# Patient Record
Sex: Female | Born: 1990 | Race: Black or African American | Hispanic: No | Smoking: Never smoker
Health system: Southern US, Community
[De-identification: ages and names within clinical notes are randomized; demographics above are authoritative.]

## PROBLEM LIST (undated history)

## (undated) DIAGNOSIS — Z789 Other specified health status: Secondary | ICD-10-CM

## (undated) DIAGNOSIS — K259 Gastric ulcer, unspecified as acute or chronic, without hemorrhage or perforation: Secondary | ICD-10-CM

## (undated) DIAGNOSIS — T8859XA Other complications of anesthesia, initial encounter: Secondary | ICD-10-CM

## (undated) HISTORY — PX: NO PAST SURGERIES: SHX2092

## (undated) HISTORY — PX: WISDOM TOOTH EXTRACTION: SHX21

---
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
## 2010-12-15 LAB — COMPREHENSIVE METABOLIC PANEL
ALT: 27 U/L (ref 0–35)
AST: 20 U/L (ref 0–37)
Alkaline Phosphatase: 65 U/L (ref 39–117)
CO2: 25 mEq/L (ref 19–32)
Chloride: 110 mEq/L (ref 96–112)
Creatinine, Ser: 0.7 mg/dL (ref 0.4–1.2)
GFR calc Af Amer: >60 mL/min (ref >60)
GFR calc non Af Amer: >60 mL/min (ref >60)
Total Bilirubin: 0.3 mg/dL (ref 0.3–1.2)

## 2010-12-15 LAB — DIFFERENTIAL
Basophils Absolute: 0 10*3/uL (ref 0.0–0.1)
Basophils Relative: 0 % (ref 0–1)
Eosinophils Absolute: 0 10*3/uL (ref 0.0–0.7)
Eosinophils Relative: 0 % (ref 0–5)

## 2010-12-15 LAB — URINALYSIS, ROUTINE W REFLEX MICROSCOPIC
Glucose, UA: NEGATIVE mg/dL
Hgb urine dipstick: NEGATIVE
Specific Gravity, Urine: 1.021 (ref 1.005–1.030)
Urobilinogen, UA: 1 mg/dL (ref 0.0–1.0)

## 2010-12-15 LAB — CBC
MCV: 91.8 fL (ref 78.0–100.0)
RBC: 3.98 MIL/uL (ref 3.87–5.11)
WBC: 11 10*3/uL — ABNORMAL HIGH (ref 4.0–10.5)

## 2010-12-15 LAB — LIPASE, BLOOD: Lipase: 38 U/L (ref 23–300)

## 2018-02-03 ENCOUNTER — Emergency Department (HOSPITAL_COMMUNITY)
Admission: EM | Admit: 2018-02-03 | Discharge: 2018-02-03 | Disposition: A | Discharge status: Home / Self Care | Payer: Self-pay | Attending: Emergency Medicine | Admitting: Emergency Medicine

## 2018-02-03 ENCOUNTER — Encounter (HOSPITAL_COMMUNITY): Payer: Self-pay | Admitting: Emergency Medicine

## 2018-02-03 DIAGNOSIS — L03011 Cellulitis of right finger: Secondary | ICD-10-CM | POA: Insufficient documentation

## 2018-02-03 HISTORY — DX: Gastric ulcer, unspecified as acute or chronic, without hemorrhage or perforation: K25.9

## 2018-02-03 MED ORDER — LIDOCAINE HCL 2 % IJ SOLN
20.0000 mL | Freq: Once | INTRAMUSCULAR | Status: AC
  Administered 2018-02-03: 400 mg
  Filled 2018-02-03: qty 20

## 2018-02-03 NOTE — ED Triage Notes (Signed)
Patient reports swelling and pain around the fingernail of her right middle finger since Tuesday. Patient states she tried to pop the swollen area but was unsuccessful. Skin intact. States she believes the index finger on the same hand is starting to swell too. Denies any other complaints.

## 2018-02-03 NOTE — Discharge Instructions (Signed)
Thank you for allowing me to care of you today in the emergency department.  The wound on your fingertip will continue to drain for the next few days.  This is normal.  Clean the fingertip daily with warm water and soap.  Pat the area dry, then apply a topical antibiotic, such as bacitracin, to the wound.  Then, apply a gauze dressing to protect the area.  You can then apply a small amount of the brown Coban.  Make sure the Coban is not too tight to cut off circulation to the fingertip.  If the dressing becomes wet or soiled, change it as frequently as needed.  Do not submerge the finger for long periods of time in a pool or hot tub until the incisions have fully closed.  Take 650 mg of Tylenol or 600 mg of ibuprofen with food once every 6 hours for pain control.  You can also apply an ice pack for 15 to 20 minutes up to 3-4 times a day to help with pain.  Return to the emergency department if you develop fever, chills, or if the fingertip becomes red, hot to the touch, or swollen.

## 2018-02-03 NOTE — ED Provider Notes (Signed)
[facility] EMERGENCY DEPARTMENT Provider Note   CSN: [id number] Arrival date & time: 02/03/18  5784     History   Chief Complaint Chief Complaint  Patient presents with  . Hand Pain    HPI Maria Barber is a 27 y.o. female who presents to the Emergency Department with a chief complaint of constant, worsening swelling and pain to the right middle finger that began 5 days ago.  She denies a fever, chills.  She reports that when she woke this morning that she began to have mild numbness to the fourth finger of the right hand.  She reports a history of previous paronychia that required incision and drainage.  She attempted to " pop" the swollen area at home but was unsuccessful.  No other treatment prior to arrival.  She is a non-smoker.  No history of diabetes mellitus or other immunocompromising pathology.  The history is provided by the patient. No language interpreter was used.  Hand Pain  Pertinent negatives include no chest pain, no abdominal pain and no shortness of breath.    Past Medical History:  Diagnosis Date  . Gastric ulcer     There are no active problems to display for this patient.   History reviewed. No pertinent surgical history.   OB History   None      Home Medications    Prior to Admission medications   Not on File    Family History No family history on file.  Social History Social History   Tobacco Use  . Smoking status: Not on file  Substance Use Topics  . Alcohol use: Not on file  . Drug use: Not on file     Allergies   Patient has no known allergies.   Review of Systems Review of Systems  Constitutional: Negative for activity change, chills and fever.  Respiratory: Negative for shortness of breath.   Cardiovascular: Negative for chest pain.  Gastrointestinal: Negative for abdominal pain.  Musculoskeletal: Negative for back pain.  Skin: Positive for color change and wound. Negative for rash.    Neurological: Negative for weakness and numbness.     Physical Exam Updated Vital Signs BP 140/83 (BP Location: Right Arm)   Pulse 76   Temp 97.6 F (36.4 C) (Oral)   Resp 16   SpO2 100%   Physical Exam  Constitutional: No distress.  HENT:  Head: Normocephalic.  Eyes: Conjunctivae are normal.  Neck: Neck supple.  Cardiovascular: Normal rate and regular rhythm. Exam reveals no gallop and no friction rub.  No murmur heard. Pulmonary/Chest: Effort normal. No respiratory distress.  Abdominal: Soft. She exhibits no distension.  Neurological: She is alert.  Skin: Skin is warm. Capillary refill takes less than 2 seconds. No rash noted.  Moderate edema noted to the distal aspect of the right middle finger and mild edema noted to the middle phalanx. Diffuse, mild tenderness throughout the distal tip. Purulence noted to the lateral aspect of the nailbed below the skin. No active drainage. Radial pulses 2+ Good capillary refill to the distal tip of the third digit. Sensation is intact throughout.   Psychiatric: Her behavior is normal.  Nursing note and vitals reviewed.    ED Treatments / Results  Labs (all labs ordered are listed, but only abnormal results are displayed) Labs Reviewed - No data to display  EKG None  Radiology No results found.  Procedures Drain paronychia Date/Time: 02/03/2018 10:54 AM Performed by: Barkley Boards, PA-C Authorized by: Lilian Kapur,  Mia A, PA-C  Consent: Verbal consent obtained. Consent given by: patient Patient understanding: patient states understanding of the procedure being performed Patient consent: the patient's understanding of the procedure matches consent given Patient identity confirmed: verbally with patient Local anesthesia used: yes Anesthesia: digital block  Anesthesia: Local anesthesia used: yes Local Anesthetic: lidocaine 2% without epinephrine Anesthetic total: 8 mL Patient tolerance: Patient tolerated the procedure well  with no immediate complications Comments: Three 0.5 cm incision were made along the medial, lateral, and proximal aspects to the skin surrounding the nail of the right middle finger. Copious amounts of purulent and bloody discharge began to spontaneously express.     (including critical care time)  Medications Ordered in ED Medications  lidocaine (XYLOCAINE) 2 % (with pres) injection 400 mg (400 mg Infiltration Given 02/03/18 8295)     Initial Impression / Assessment and Plan / ED Course  I have reviewed the triage vital signs and the nursing notes.  Pertinent labs & imaging results that were available during my care of the patient were reviewed by me and considered in my medical decision making (see chart for details).    27 y.o. female with paronychia to the right middle finger. The risks and benefits of the procedure were discussed with the patient, which the patient understood and was agreeable to.  Three separate 0.5 cm incisions were made along the lateral, medial, and proximal aspect of the nailbed of the right middle finger. A moderate amount of purulent discharge was expressed. The digit was copiously irrigated and wrapped with a sterile bandage after applying a topical antibiotic. The patient has no risks factors for impaired wound healing. Will d/c the patient to home without antibiotics. Strict return precautions to the ED given. NAD. VSS. Will d/c the patient to home at this time.   Final Clinical Impressions(s) / ED Diagnoses   Final diagnoses:  Paronychia of right middle finger    ED Discharge Orders    None       Barkley Boards, PA-C 02/03/18 1150    Cardama, Amadeo Garnet, MD 02/03/18 [phone]

## 2018-09-11 ENCOUNTER — Emergency Department (HOSPITAL_COMMUNITY): Payer: Self-pay

## 2018-09-11 ENCOUNTER — Encounter (HOSPITAL_COMMUNITY): Payer: Self-pay | Admitting: Emergency Medicine

## 2018-09-11 ENCOUNTER — Other Ambulatory Visit: Payer: Self-pay

## 2018-09-11 ENCOUNTER — Emergency Department (HOSPITAL_COMMUNITY)
Admission: EM | Admit: 2018-09-11 | Discharge: 2018-09-11 | Disposition: A | Discharge status: Home / Self Care | Payer: Self-pay | Attending: Emergency Medicine | Admitting: Emergency Medicine

## 2018-09-11 DIAGNOSIS — J189 Pneumonia, unspecified organism: Secondary | ICD-10-CM | POA: Insufficient documentation

## 2018-09-11 DIAGNOSIS — R109 Unspecified abdominal pain: Secondary | ICD-10-CM

## 2018-09-11 DIAGNOSIS — R1013 Epigastric pain: Secondary | ICD-10-CM | POA: Insufficient documentation

## 2018-09-11 DIAGNOSIS — R1011 Right upper quadrant pain: Secondary | ICD-10-CM | POA: Insufficient documentation

## 2018-09-11 LAB — COMPREHENSIVE METABOLIC PANEL
ALBUMIN: 3.8 g/dL (ref 3.5–5.0)
ALK PHOS: 32 U/L — AB (ref 38–126)
ALT: 20 U/L (ref 0–44)
AST: 22 U/L (ref 15–41)
Anion gap: 12 (ref 5–15)
BILIRUBIN TOTAL: 0.5 mg/dL (ref 0.3–1.2)
BUN: 6 mg/dL (ref 6–20)
CALCIUM: 9 mg/dL (ref 8.9–10.3)
CO2: 26 mmol/L (ref 22–32)
Chloride: 103 mmol/L (ref 98–111)
Creatinine, Ser: 0.96 mg/dL (ref 0.44–1.00)
GFR calc Af Amer: >60 mL/min (ref >60)
GFR calc non Af Amer: >60 mL/min (ref >60)
GLUCOSE: 92 mg/dL (ref 70–99)
POTASSIUM: 3.9 mmol/L (ref 3.5–5.1)
Sodium: 141 mmol/L (ref 135–145)
TOTAL PROTEIN: 7.9 g/dL (ref 6.5–8.1)

## 2018-09-11 LAB — CBC WITH DIFFERENTIAL/PLATELET
ABS IMMATURE GRANULOCYTES: 0.03 10*3/uL (ref 0.00–0.07)
BASOS ABS: 0 10*3/uL (ref 0.0–0.1)
Basophils Relative: 0 %
Eosinophils Absolute: 0 10*3/uL (ref 0.0–0.5)
Eosinophils Relative: 0 %
HEMATOCRIT: 42.4 % (ref 36.0–46.0)
HEMOGLOBIN: 13.8 g/dL (ref 12.0–15.0)
IMMATURE GRANULOCYTES: 0 %
LYMPHS ABS: 1.6 10*3/uL (ref 0.7–4.0)
LYMPHS PCT: 22 %
MCH: 31.2 pg (ref 26.0–34.0)
MCHC: 32.5 g/dL (ref 30.0–36.0)
MCV: 95.9 fL (ref 80.0–100.0)
Monocytes Absolute: 0.8 10*3/uL (ref 0.1–1.0)
Monocytes Relative: 10 %
NEUTROS PCT: 68 %
NRBC: 0 % (ref 0.0–0.2)
Neutro Abs: 5 10*3/uL (ref 1.7–7.7)
Platelets: 255 10*3/uL (ref 150–400)
RBC: 4.42 MIL/uL (ref 3.87–5.11)
RDW: 11.5 % (ref 11.5–15.5)
WBC: 7.5 10*3/uL (ref 4.0–10.5)

## 2018-09-11 LAB — URINALYSIS, ROUTINE W REFLEX MICROSCOPIC
BILIRUBIN URINE: NEGATIVE
Glucose, UA: NEGATIVE mg/dL
HGB URINE DIPSTICK: NEGATIVE
KETONES UR: 20 mg/dL — AB
LEUKOCYTES UA: NEGATIVE
Nitrite: NEGATIVE
PROTEIN: 30 mg/dL — AB
Specific Gravity, Urine: 1.032 — ABNORMAL HIGH (ref 1.005–1.030)
pH: 5 (ref 5.0–8.0)

## 2018-09-11 LAB — I-STAT BETA HCG BLOOD, ED (MC, WL, AP ONLY)

## 2018-09-11 LAB — LIPASE, BLOOD: Lipase: 20 U/L (ref 11–51)

## 2018-09-11 MED ORDER — ACETAMINOPHEN 325 MG PO TABS
650.0000 mg | ORAL_TABLET | Freq: Once | ORAL | Status: AC
  Administered 2018-09-11: 650 mg via ORAL
  Filled 2018-09-11: qty 2

## 2018-09-11 MED ORDER — DOXYCYCLINE HYCLATE 100 MG PO CAPS: 100.0000 mg | ORAL_CAPSULE | Freq: Two times a day (BID) | ORAL | 0 refills | Status: AC

## 2018-09-11 MED ORDER — DOXYCYCLINE HYCLATE 100 MG PO TABS
100.0000 mg | ORAL_TABLET | Freq: Once | ORAL | Status: AC
  Administered 2018-09-11: 100 mg via ORAL
  Filled 2018-09-11: qty 1

## 2018-09-11 MED ORDER — ONDANSETRON HCL 4 MG PO TABS
4.0000 mg | ORAL_TABLET | Freq: Once | ORAL | Status: AC
  Administered 2018-09-11: 4 mg via ORAL
  Filled 2018-09-11: qty 1

## 2018-09-11 NOTE — ED Notes (Signed)
Pt stable, ambulatory, states understanding of discharge instructions 

## 2018-09-11 NOTE — ED Provider Notes (Signed)
[facility] EMERGENCY DEPARTMENT Provider Note   CSN: [id number] Arrival date & time: 09/11/18  2122     History   Chief Complaint Chief Complaint  Patient presents with  . Cough    HPI Maria Barber is a 27 y.o. female.  HPI   Pt is a 27 y/o female with a h/o gastric ulcer who presents to the ED today c/o a cough that began about 5 days ago. Cough is productive with yellow sputum. Also c/o body aches, rhinorrhea, congestion, sore throat, fevers and chills. Fevers have resolved.  States she has bilat upper chest pain that is only present with cough. She feel mildly sob. She has tried dayquil and nyquil without resolution of sxs. Sxs are worse at night when she tries to sleep.  She is c/o ruq abd pain that began this AM. Rates pain 8/10. She reports nausea, but no vomiting, diarrhea, constipation or urinary sxs.  Denies leg pain/swelling, hemoptysis, recent surgery/trauma, recent long travel, hormone use, personal hx of cancer, or hx of DVT/PE.   Past Medical History:  Diagnosis Date  . Gastric ulcer     There are no active problems to display for this patient.   History reviewed. No pertinent surgical history.   OB History   No obstetric history on file.      Home Medications    Prior to Admission medications   Medication Sig Start Date End Date Taking? Authorizing Provider  doxycycline (VIBRAMYCIN) 100 MG capsule Take 1 capsule (100 mg total) by mouth 2 (two) times daily for 7 days. 09/11/18 09/18/18  Rosalie Gelpi S, PA-C    Family History No family history on file.  Social History Social History   Tobacco Use  . Smoking status: Never Smoker  . Smokeless tobacco: Never Used  Substance Use Topics  . Alcohol use: Not Currently  . Drug use: Not Currently     Allergies   Patient has no known allergies.   Review of Systems Review of Systems  Constitutional: Positive for chills and fever.  HENT: Positive for congestion, rhinorrhea  and sore throat.   Eyes: Negative for visual disturbance.  Respiratory: Positive for cough and shortness of breath.   Cardiovascular: Positive for chest pain (only with cough). Negative for leg swelling.  Gastrointestinal: Positive for abdominal pain and nausea. Negative for constipation, diarrhea and vomiting.  Genitourinary: Negative for dysuria.  Musculoskeletal: Negative for back pain.  Skin: Negative for rash.  Neurological: Negative for headaches.     Physical Exam Updated Vital Signs BP (!) 138/100 (BP Location: Right Wrist)   Pulse (!) 112   Temp 98.2 F (36.8 C) (Oral)   Resp 18   SpO2 100%   Physical Exam Vitals signs and nursing note reviewed.  Constitutional:      General: She is not in acute distress.    Appearance: She is well-developed. She is not ill-appearing or toxic-appearing.  HENT:     Head: Normocephalic and atraumatic.     Right Ear: Tympanic membrane normal.     Left Ear: Tympanic membrane normal.     Nose: Congestion present.     Mouth/Throat:     Mouth: Mucous membranes are moist.     Pharynx: No oropharyngeal exudate or posterior oropharyngeal erythema.  Eyes:     Conjunctiva/sclera: Conjunctivae normal.  Neck:     Musculoskeletal: Neck supple.  Cardiovascular:     Rate and Rhythm: Normal rate and regular rhythm.     Heart  sounds: Normal heart sounds. No murmur.  Pulmonary:     Effort: Pulmonary effort is normal. No respiratory distress.     Breath sounds: Normal breath sounds. No stridor. No wheezing, rhonchi or rales.  Abdominal:     General: Bowel sounds are normal. There is no distension.     Palpations: Abdomen is soft.     Tenderness: There is abdominal tenderness ( ruq and epigastric ). There is guarding (voluntary). There is no rebound.  Musculoskeletal:     Right lower leg: No edema.     Left lower leg: No edema.  Skin:    General: Skin is warm and dry.  Neurological:     Mental Status: She is alert.      ED Treatments /  Results  Labs (all labs ordered are listed, but only abnormal results are displayed) Labs Reviewed  COMPREHENSIVE METABOLIC PANEL - Abnormal; Notable for the following components:      Result Value   Alkaline Phosphatase 32 (*)    All other components within normal limits  URINALYSIS, ROUTINE W REFLEX MICROSCOPIC - Abnormal; Notable for the following components:   Color, Urine AMBER (*)    APPearance HAZY (*)    Specific Gravity, Urine 1.032 (*)    Ketones, ur 20 (*)    Protein, ur 30 (*)    Bacteria, UA RARE (*)    All other components within normal limits  CBC WITH DIFFERENTIAL/PLATELET  LIPASE, BLOOD  I-STAT BETA HCG BLOOD, ED ([facility], [facility], [facility] ONLY)    EKG None  Radiology Dg Chest 2 View  Result Date: 09/11/2018 CLINICAL DATA:  Deep cough with yellow productive sputum for about 1 week. EXAM: CHEST - 2 VIEW COMPARISON:  None. FINDINGS: Consolidation RIGHT upper lobe along horizontal fissure. No effusion or pneumothorax. Clear LEFT lung. Normal cardiomediastinal silhouette. Bones unremarkable. IMPRESSION: RIGHT upper lobe pneumonia. Electronically Signed   By: Staci Righter M.D.   On: 09/11/2018 09:45    Procedures Procedures (including critical care time)  Medications Ordered in ED Medications  acetaminophen (TYLENOL) tablet 650 mg (650 mg Oral Given 09/11/18 1035)  ondansetron (ZOFRAN) tablet 4 mg (4 mg Oral Given 09/11/18 1035)  doxycycline (VIBRA-TABS) tablet 100 mg (100 mg Oral Given 09/11/18 1034)     Initial Impression / Assessment and Plan / ED Course  I have reviewed the triage vital signs and the nursing notes.  Pertinent labs & imaging results that were available during my care of the patient were reviewed by me and considered in my medical decision making (see chart for details).     Final Clinical Impressions(s) / ED Diagnoses   Final diagnoses:  Community acquired pneumonia of right lung, unspecified part of lung  Abdominal pain, unspecified abdominal  location   Patient presenting to the ED today with multiple complaints.  Is complaining of a cough and URI symptoms.  Also complaining of right upper quadrant abdominal pain associated with nausea.  No vomiting diarrhea or other symptoms.  On exam, lungs are grossly clear.  Cardiac exam benign.  She does have some mild right upper quadrant epigastric abdominal tenderness without involuntary guarding, rebound or rigidity.  On reevaluation patient feels somewhat improved after Zofran and medications.  She has been able to tolerate p.o. in the ED.  Patient has nonsurgical abdomen.  Laboratory work is reassuring.  CBC without leukocytosis.  CMP without electrolyte derangement.  Kidney and liver function are normal.  Alk phos and total bilirubin are non-concerning.  Lipase  negative.  UA without evidence of urinary tract infection.  Beta-hCG is negative.  Do not feel that further imaging or work-up necessary at this time given grossly benign abdominal exam.  Her chest x-ray does show right upper lobe pneumonia which may be contributing to her symptoms.  She was given first dose of doxycycline here.  Advised hydration and continuation of doxycycline at home for her symptoms.  Have advised close follow-up if she has any new or worsening symptoms in the meantime.  She voices understanding of the plan and reasons to return to the ED.  All questions answered.  ED Discharge Orders         Ordered    doxycycline (VIBRAMYCIN) 100 MG capsule  2 times daily     09/11/18 [address], [place], PA-C 09/11/18 1140    Margette Fast, MD 09/11/18 1914

## 2018-09-11 NOTE — Discharge Instructions (Signed)
You were given a prescription for antibiotics. Please take the antibiotic prescription fully.  ° °Please follow up with your primary doctor within the next 5-7 days.  If you do not have a primary care provider, information for a healthcare clinic has been provided for you to make arrangements for follow up care. Please return to the ER sooner if you have any new or worsening symptoms, or if you have any of the following symptoms: ° °Abdominal pain that does not go away.  °You have a fever.  °You keep throwing up (vomiting).  °The pain is felt only in portions of the abdomen. Pain in the right side could possibly be appendicitis. In an adult, pain in the left lower portion of the abdomen could be colitis or diverticulitis.  °You pass bloody or black tarry stools.  °There is bright red blood in the stool.  °The constipation stays for more than 4 days.  °There is belly (abdominal) or rectal pain.  °You do not seem to be getting better.  °You have any questions or concerns.  ° °

## 2018-09-11 NOTE — ED Triage Notes (Signed)
Pt reporting deep cough with yellow productive sputum for a couple days. Using OTC meds.

## 2020-08-09 ENCOUNTER — Emergency Department (HOSPITAL_COMMUNITY)
Admission: EM | Admit: 2020-08-09 | Discharge: 2020-08-09 | Disposition: A | Discharge status: Home / Self Care | Payer: Self-pay | Attending: Emergency Medicine | Admitting: Emergency Medicine

## 2020-08-09 ENCOUNTER — Emergency Department (HOSPITAL_COMMUNITY): Payer: Self-pay

## 2020-08-09 ENCOUNTER — Other Ambulatory Visit: Payer: Self-pay

## 2020-08-09 ENCOUNTER — Encounter (HOSPITAL_COMMUNITY): Payer: Self-pay | Admitting: Emergency Medicine

## 2020-08-09 DIAGNOSIS — M7522 Bicipital tendinitis, left shoulder: Secondary | ICD-10-CM | POA: Insufficient documentation

## 2020-08-09 DIAGNOSIS — M778 Other enthesopathies, not elsewhere classified: Secondary | ICD-10-CM

## 2020-08-09 MED ORDER — DEXAMETHASONE SODIUM PHOSPHATE 10 MG/ML IJ SOLN
10.0000 mg | Freq: Once | INTRAMUSCULAR | Status: AC
  Administered 2020-08-09: 10 mg via INTRAMUSCULAR
  Filled 2020-08-09: qty 1

## 2020-08-09 MED ORDER — IBUPROFEN 600 MG PO TABS: 600.0000 mg | ORAL_TABLET | Freq: Four times a day (QID) | ORAL | 0 refills | Status: DC | PRN

## 2020-08-09 MED ORDER — METHYLPREDNISOLONE 4 MG PO TBPK: ORAL_TABLET | ORAL | 0 refills | Status: DC

## 2020-08-09 MED ORDER — KETOROLAC TROMETHAMINE 30 MG/ML IJ SOLN
30.0000 mg | Freq: Once | INTRAMUSCULAR | Status: AC
  Administered 2020-08-09: 30 mg via INTRAMUSCULAR
  Filled 2020-08-09: qty 1

## 2020-08-09 NOTE — ED Triage Notes (Signed)
C/o L shoulder pain x 2 days.  No known injury.  Pain worse with movement.

## 2020-08-09 NOTE — ED Provider Notes (Signed)
[facility] EMERGENCY DEPARTMENT Provider Note   CSN: [id number] Arrival date & time: 08/09/20  0710     History Chief Complaint  Patient presents with   Shoulder Pain    Maria Barber is a 29 y.o. female.  Pt presents to the ED today with left shoulder pain.  Pt said she woke up with pain to the left shoulder about 2 mornings ago.  Pt said it is worse with movement.  She said it is not getting better.        Past Medical History:  Diagnosis Date   Gastric ulcer     There are no problems to display for this patient.   History reviewed. No pertinent surgical history.   OB History   No obstetric history on file.     No family history on file.  Social History   Tobacco Use   Smoking status: Never Smoker   Smokeless tobacco: Never Used  Substance Use Topics   Alcohol use: Not Currently   Drug use: Not Currently    Home Medications Prior to Admission medications   Medication Sig Start Date End Date Taking? Authorizing Provider  ibuprofen (ADVIL) 600 MG tablet Take 1 tablet (600 mg total) by mouth every 6 (six) hours as needed. 08/09/20   Jacalyn Lefevre, MD  methylPREDNISolone (MEDROL DOSEPAK) 4 MG TBPK tablet Take as directed 08/09/20   Jacalyn Lefevre, MD    Allergies    Patient has no known allergies.  Review of Systems   Review of Systems  Musculoskeletal:       Left shoulder pain  All other systems reviewed and are negative.   Physical Exam Updated Vital Signs BP (!) 145/91 (BP Location: Right Arm)    Pulse 90    Temp 97.9 F (36.6 C) (Oral)    Resp 18    LMP 07/10/2020    SpO2 97%   Physical Exam Vitals and nursing note reviewed.  Constitutional:      Appearance: Normal appearance.  HENT:     Head: Normocephalic and atraumatic.     Right Ear: External ear normal.     Left Ear: External ear normal.     Nose: Nose normal.     Mouth/Throat:     Mouth: Mucous membranes are moist.     Pharynx: Oropharynx is clear.   Eyes:     Extraocular Movements: Extraocular movements intact.     Conjunctiva/sclera: Conjunctivae normal.     Pupils: Pupils are equal, round, and reactive to light.  Cardiovascular:     Rate and Rhythm: Normal rate and regular rhythm.     Pulses: Normal pulses.     Heart sounds: Normal heart sounds.  Pulmonary:     Effort: Pulmonary effort is normal.     Breath sounds: Normal breath sounds.  Abdominal:     General: Abdomen is flat. Bowel sounds are normal.     Palpations: Abdomen is soft.  Musculoskeletal:     Left shoulder: Tenderness present. Decreased range of motion.     Cervical back: Normal range of motion and neck supple.     Comments: Left AC point tenderness.  Skin:    General: Skin is warm.     Capillary Refill: Capillary refill takes less than 2 seconds.  Neurological:     General: No focal deficit present.     Mental Status: She is alert and oriented to person, place, and time.     ED Results / Procedures /  Treatments   Labs (all labs ordered are listed, but only abnormal results are displayed) Labs Reviewed - No data to display  EKG None  Radiology DG Shoulder Left  Result Date: 08/09/2020 CLINICAL DATA:  Left shoulder pain EXAM: LEFT SHOULDER - 2+ VIEW COMPARISON:  None. FINDINGS: There is no evidence of fracture or dislocation. There is no evidence of arthropathy or other focal bone abnormality. Soft tissues are unremarkable. IMPRESSION: Negative. Electronically Signed   By: Signa Kell M.D.   On: 08/09/2020 08:01    Procedures Procedures (including critical care time)  Medications Ordered in ED Medications  dexamethasone (DECADRON) injection 10 mg (10 mg Intramuscular Given 08/09/20 0810)  ketorolac (TORADOL) 30 MG/ML injection 30 mg (30 mg Intramuscular Given 08/09/20 0809)    ED Course  I have reviewed the triage vital signs and the nursing notes.  Pertinent labs & imaging results that were available during my care of the patient were  reviewed by me and considered in my medical decision making (see chart for details).    MDM Rules/Calculators/A&P                         Pt likely has tendonitis.   Pt will be d/c home with a medrol dose pack and ibuprofen.  She is instructed to f/u with ortho.  Return if worse. Final Clinical Impression(s) / ED Diagnoses Final diagnoses:  Shoulder tendonitis, left    Rx / DC Orders ED Discharge Orders         Ordered    methylPREDNISolone (MEDROL DOSEPAK) 4 MG TBPK tablet        08/09/20 0814    ibuprofen (ADVIL) 600 MG tablet  Every 6 hours PRN        08/09/20 0626           Jacalyn Lefevre, MD 08/09/20 [phone]

## 2020-08-09 NOTE — ED Notes (Signed)
Pt discharge instructions and prescriptions reviewed with the patient. The patient verbalized understanding of both. Pt discharged. 

## 2020-08-11 ENCOUNTER — Encounter (HOSPITAL_COMMUNITY): Payer: Self-pay | Admitting: Emergency Medicine

## 2020-08-11 ENCOUNTER — Emergency Department (HOSPITAL_COMMUNITY): Payer: Self-pay

## 2020-08-11 ENCOUNTER — Other Ambulatory Visit: Payer: Self-pay

## 2020-08-11 ENCOUNTER — Emergency Department (HOSPITAL_COMMUNITY)
Admission: EM | Admit: 2020-08-11 | Discharge: 2020-08-11 | Disposition: A | Discharge status: Home / Self Care | Payer: Self-pay | Attending: Emergency Medicine | Admitting: Emergency Medicine

## 2020-08-11 DIAGNOSIS — R1011 Right upper quadrant pain: Secondary | ICD-10-CM

## 2020-08-11 DIAGNOSIS — R112 Nausea with vomiting, unspecified: Secondary | ICD-10-CM

## 2020-08-11 LAB — COMPREHENSIVE METABOLIC PANEL
ALT: 22 U/L (ref 0–44)
AST: 20 U/L (ref 15–41)
Albumin: 3.6 g/dL (ref 3.5–5.0)
Alkaline Phosphatase: 36 U/L — ABNORMAL LOW (ref 38–126)
Anion gap: 7 (ref 5–15)
BUN: 15 mg/dL (ref 6–20)
CO2: 22 mmol/L (ref 22–32)
Calcium: 8.5 mg/dL — ABNORMAL LOW (ref 8.9–10.3)
Chloride: 108 mmol/L (ref 98–111)
Creatinine, Ser: 0.82 mg/dL (ref 0.44–1.00)
GFR, Estimated: >60 mL/min (ref >60)
Glucose, Bld: 104 mg/dL — ABNORMAL HIGH (ref 70–99)
Potassium: 4 mmol/L (ref 3.5–5.1)
Sodium: 137 mmol/L (ref 135–145)
Total Bilirubin: 0.6 mg/dL (ref 0.3–1.2)
Total Protein: 6.9 g/dL (ref 6.5–8.1)

## 2020-08-11 LAB — I-STAT BETA HCG BLOOD, ED (MC, WL, AP ONLY): I-stat hCG, quantitative: <5 m[IU]/mL (ref <5)

## 2020-08-11 LAB — URINALYSIS, ROUTINE W REFLEX MICROSCOPIC
Bilirubin Urine: NEGATIVE
Glucose, UA: NEGATIVE mg/dL
Hgb urine dipstick: NEGATIVE
Ketones, ur: NEGATIVE mg/dL
Leukocytes,Ua: NEGATIVE
Nitrite: NEGATIVE
Protein, ur: NEGATIVE mg/dL
Specific Gravity, Urine: 1.028 (ref 1.005–1.030)
pH: 5 (ref 5.0–8.0)

## 2020-08-11 LAB — CBC
HCT: 44.2 % (ref 36.0–46.0)
Hemoglobin: 14.2 g/dL (ref 12.0–15.0)
MCH: 31.3 pg (ref 26.0–34.0)
MCHC: 32.1 g/dL (ref 30.0–36.0)
MCV: 97.6 fL (ref 80.0–100.0)
Platelets: 323 10*3/uL (ref 150–400)
RBC: 4.53 MIL/uL (ref 3.87–5.11)
RDW: 11.9 % (ref 11.5–15.5)
WBC: 11.9 10*3/uL — ABNORMAL HIGH (ref 4.0–10.5)
nRBC: 0 % (ref 0.0–0.2)

## 2020-08-11 LAB — LIPASE, BLOOD: Lipase: 36 U/L (ref 11–51)

## 2020-08-11 LAB — POC OCCULT BLOOD, ED: Fecal Occult Bld: NEGATIVE

## 2020-08-11 MED ORDER — PANTOPRAZOLE SODIUM 40 MG PO TBEC
40.0000 mg | DELAYED_RELEASE_TABLET | Freq: Once | ORAL | Status: AC
  Administered 2020-08-11: 40 mg via ORAL
  Filled 2020-08-11: qty 1

## 2020-08-11 MED ORDER — ONDANSETRON 4 MG PO TBDP
4.0000 mg | ORAL_TABLET | Freq: Once | ORAL | Status: AC | PRN
  Administered 2020-08-11: 4 mg via ORAL
  Filled 2020-08-11 (×2): qty 1

## 2020-08-11 MED ORDER — ONDANSETRON HCL 4 MG PO TABS: 4.0000 mg | ORAL_TABLET | Freq: Three times a day (TID) | ORAL | 0 refills | Status: DC | PRN

## 2020-08-11 MED ORDER — IOHEXOL 300 MG/ML  SOLN
100.0000 mL | Freq: Once | INTRAMUSCULAR | Status: AC | PRN
  Administered 2020-08-11: 100 mL via INTRAVENOUS

## 2020-08-11 MED ORDER — SODIUM CHLORIDE 0.9 % IV SOLN: Freq: Once | INTRAVENOUS | Status: AC

## 2020-08-11 MED ORDER — PANTOPRAZOLE SODIUM 20 MG PO TBEC: 20.0000 mg | DELAYED_RELEASE_TABLET | Freq: Every day | ORAL | 0 refills | Status: DC

## 2020-08-11 NOTE — ED Notes (Signed)
Pt given 4mg  ODT Zofran in triage for excessive emesis

## 2020-08-11 NOTE — Discharge Instructions (Addendum)
You came to the emergency department evaluated for your nausea, vomiting and abdominal pain.  Your lab work and CT scan were reassuring that no acute injury or illness is present.  I have prescribed you Protonix, please take this medication once every day.  I have also prescribe you Zofran (an anti-nausea medication) please take 1 every 8 hours as needed for nausea.  I have given you information to follow up a gastrointestinal doctor.  Please make sure to follow up with them.  You may also follow-up with your primary care provider as needed if symptoms persist.  Please take Tylenol (acetaminophen) to relieve your pain.  You may take tylenol, up to 1,000 mg (two extra strength pills).  Do not take more than 3,000 mg tylenol in a 24 hour period.  Please check all medication labels as many medications such as pain and cold medications may contain tylenol. Please do not drink alcohol while taking this medication.    Please return to the emergency department if: Your pain does not go away as soon as your health care provider told you to expect. You cannot stop vomiting. Your pain is only in areas of the abdomen, such as the right side or the left lower portion of the abdomen. Pain on the right side could be caused by appendicitis. You have bloody or black stools, or stools that look like tar. You have severe pain, cramping, or bloating in your abdomen. You have signs of dehydration, such as: Dark urine, very little urine, or no urine. Cracked lips. Dry mouth. Sunken eyes. Sleepiness. Weakness. You have trouble breathing or chest pain.

## 2020-08-11 NOTE — ED Triage Notes (Signed)
Patient arrives to ED with complaints of vomiting bright red blood this morning. Pt complaints of RUQ constant sharp pain. Pt states nausea is also constant. Pt was recently prescribed methylprednisolone and took first dose yesterday.

## 2020-08-11 NOTE — ED Provider Notes (Signed)
[facility] Provider Note   CSN: [id number] Arrival date & time: 08/11/20  1937     History Chief Complaint  Patient presents with  . Hematemesis    Maria Barber is a 29 y.o. female with a history of gastric ulcers.  Patient presents with a chief complaint of upper right quadrant abdominal pain, nausea, and vomiting.  Patient reports that she always has intermittent upper right quadrant pain at baseline.  Patient states that yesterday around 11 PM at night her upper right quadrant pain became progressively worse, she described the pain as sharp, 8/10 on pain scale, does not radiate, nothing makes it better or worse.  Patient also reports nausea and vomiting accompanying worsening of her pain.  Patient reports 7-8 episodes of emesis.  Patient states after the first episode she started to notice some bright red blood mixed in with her emesis.  Patient denies any coffee-ground emesis.  Patient denies any hematochezia, dark tarry stools, fevers, chills, lightheadedness, syncope, vaginal bleeding, vaginal pain.  Patient denies taking any acid suppression medication.  Patient reports her last EGD was 2 to 3 years prior.  Patient denies any abdominal surgeries.  LMP was 10/29.  Patient endorses daily marijuana use.   HPI     Past Medical History:  Diagnosis Date  . Gastric ulcer     There are no problems to display for this patient.   History reviewed. No pertinent surgical history.   OB History   No obstetric history on file.     History reviewed. No pertinent family history.  Social History   Tobacco Use  . Smoking status: Never Smoker  . Smokeless tobacco: Never Used  Substance Use Topics  . Alcohol use: Not Currently  . Drug use: Not Currently    Home Medications Prior to Admission medications   Medication Sig Start Date End Date Taking? Authorizing Provider  methylPREDNISolone (MEDROL DOSEPAK) 4 MG TBPK tablet Take as  directed Patient taking differently: Take 4-24 mg by mouth See admin instructions. Take as directed for 6 days. 08/09/20  Yes Isla Pence, MD  ibuprofen (ADVIL) 600 MG tablet Take 1 tablet (600 mg total) by mouth every 6 (six) hours as needed. Patient not taking: Reported on 08/11/2020 08/09/20   Isla Pence, MD  ondansetron (ZOFRAN) 4 MG tablet Take 1 tablet (4 mg total) by mouth every 8 (eight) hours as needed for nausea or vomiting. 08/11/20   Loni Beckwith, PA-C  pantoprazole (PROTONIX) 20 MG tablet Take 1 tablet (20 mg total) by mouth daily. 08/11/20 09/10/20  Loni Beckwith, PA-C    Allergies    Patient has no known allergies.  Review of Systems   Review of Systems  Physical Exam Updated Vital Signs BP 105/64   Pulse 95   Temp 98.6 F (37 C) (Oral)   Resp (!) 26   Ht 5' 3"  (1.6 m)   Wt 102.1 kg   SpO2 100%   BMI 39.86 kg/m   Physical Exam Exam conducted with a chaperone present (Rectal exam was chaperoned by female nursing student).  Constitutional:      General: She is not in acute distress.    Appearance: She is obese. She is not ill-appearing, toxic-appearing or diaphoretic.  HENT:     Head: Normocephalic.  Cardiovascular:     Rate and Rhythm: Normal rate and regular rhythm.     Heart sounds: Normal heart sounds.  Pulmonary:     Effort: Pulmonary effort  is normal.     Breath sounds: Normal breath sounds.  Abdominal:     General: Abdomen is protuberant. Bowel sounds are normal. There is no distension.     Palpations: Abdomen is soft. There is no mass.     Tenderness: There is abdominal tenderness in the right upper quadrant and right lower quadrant. There is no left CVA tenderness. Positive signs include Murphy's sign and McBurney's sign. Negative signs include psoas sign.     Hernia: No hernia is present.  Genitourinary:    Rectum: Guaiac result negative.  Skin:    General: Skin is warm and dry.  Neurological:     General: No focal  deficit present.     Mental Status: She is alert.  Psychiatric:        Behavior: Behavior is cooperative.     ED Results / Procedures / Treatments   Labs (all labs ordered are listed, but only abnormal results are displayed) Labs Reviewed  COMPREHENSIVE METABOLIC PANEL - Abnormal; Notable for the following components:      Result Value   Glucose, Bld 104 (*)    Calcium 8.5 (*)    Alkaline Phosphatase 36 (*)    All other components within normal limits  CBC - Abnormal; Notable for the following components:   WBC 11.9 (*)    All other components within normal limits  URINALYSIS, ROUTINE W REFLEX MICROSCOPIC - Abnormal; Notable for the following components:   APPearance HAZY (*)    All other components within normal limits  LIPASE, BLOOD  I-STAT BETA HCG BLOOD, ED ([facility], [facility], [facility] ONLY)  POC OCCULT BLOOD, ED    EKG None  Radiology CT ABDOMEN PELVIS W CONTRAST  Result Date: 08/11/2020 CLINICAL DATA:  Nausea, vomiting, right upper quadrant pain EXAM: CT ABDOMEN AND PELVIS WITH CONTRAST TECHNIQUE: Multidetector CT imaging of the abdomen and pelvis was performed using the standard protocol following bolus administration of intravenous contrast. CONTRAST:  138m OMNIPAQUE IOHEXOL 300 MG/ML  SOLN COMPARISON:  2010 FINDINGS: Lower chest: No acute abnormality. Hepatobiliary: No focal liver abnormality is seen. No gallstones, gallbladder wall thickening, or biliary dilatation. Pancreas: Unremarkable. No pancreatic ductal dilatation or surrounding inflammatory changes. Spleen: Normal in size without focal abnormality. Adrenals/Urinary Tract: Adrenals, kidneys, and poorly distended bladder are unremarkable. Stomach/Bowel: Stomach is within normal limits. Bowel is normal in caliber. Normal appendix. Vascular/Lymphatic: No significant vascular findings. No enlarged lymph nodes. Reproductive: Uterus and bilateral adnexa are unremarkable. Other: No free fluid.  Abdominal wall is unremarkable.  Musculoskeletal: No acute osseous abnormality. IMPRESSION: No acute abnormality or findings to account for reported symptoms. Electronically Signed   By: PMacy MisM.D.   On: 08/11/2020 13:38    Procedures Procedures (including critical care time)  Medications Ordered in ED Medications  ondansetron (ZOFRAN-ODT) disintegrating tablet 4 mg (4 mg Oral Given 08/11/20 0933)  pantoprazole (PROTONIX) EC tablet 40 mg (40 mg Oral Given 08/11/20 1003)  0.9 %  sodium chloride infusion ( Intravenous Stopped 08/11/20 1355)  iohexol (OMNIPAQUE) 300 MG/ML solution 100 mL (100 mLs Intravenous Contrast Given 08/11/20 1328)    ED Course  I have reviewed the triage vital signs and the nursing notes.  Pertinent labs & imaging results that were available during my care of the patient were reviewed by me and considered in my medical decision making (see chart for details).    MDM Rules/Calculators/A&P  Alert 29 year old female in no acute distress presents with a chief complaint of lower right quadrant abdominal pain nausea, and vomiting.  Patient indicates that her upper right quadrant abdominal pain is a chronic problem however the pain became progressively worse last night around 11 PM.  Patient indicates seven- eight episodes of vomiting.  After her initial episode of vomiting which was quite forceful she reports seeing bright red blood mixed with her emesis.  Described the pain as sharp, 8/10 on pain scale, does not radiate, nothing makes it better or worse.  No hematochezia, blood in stool, or black tarry stools.  Patient has a history of gastric ulcers, not on any medication at present.  Patient endorses daily marijuana use. Pt denies any tobacco or alcohol use.  Soft, nondistended abdomen, pain is to the right upper and lower quadrant, positive Murphy sign and McBurney sign.  Stool guaiac test negative.    WBC elevated at 11.9; H&H within normal; pregnancy test negative,  lipase within normal limits; UA is unremarkable; CMP shows alk phos slightly lower appears to be patient's baseline, Ca 8.5.  Patient will right abdominal tenderness and leukocytosis could indicate appendicitis; CT of abdomen pelvis was ordered and pending.  While labs are reassuring physical exam shows potential for biliary disease; CT scan will help investigate further.  Patient's daily marijuana use, of his hyperemesis is considered.  Peptic ulcer disease also considered.    CT scan showed No acute abnormality or findings to account for reported symptoms.  Patient was able to tolerate p.o. liquids.  Patient is hemodynamically stable, has had no episodes of hematemesis, hemoptysis during her stay in the ED, hemoccult negative and no signs of active bleeding.  Patient reports improved pain and no nausea. Patient  will be prescribed protonix, zofran and have follow up with GI.    Patient expresses understanding of this plan and is amenable to it.  She was given strict return precautions.        Final Clinical Impression(s) / ED Diagnoses Final diagnoses:  Non-intractable vomiting with nausea, unspecified vomiting type  Right upper quadrant abdominal pain    Rx / DC Orders ED Discharge Orders         Ordered    pantoprazole (PROTONIX) 20 MG tablet  Daily        08/11/20 1410    ondansetron (ZOFRAN) 4 MG tablet  Every 8 hours PRN        08/11/20 1410           Loni Beckwith, PA-C 08/11/20 1545    Hayden Rasmussen, MD 08/11/20 Einar Crow

## 2020-08-13 ENCOUNTER — Encounter (HOSPITAL_BASED_OUTPATIENT_CLINIC_OR_DEPARTMENT_OTHER): Payer: Self-pay | Admitting: *Deleted

## 2020-08-13 ENCOUNTER — Other Ambulatory Visit: Payer: Self-pay

## 2020-08-13 ENCOUNTER — Emergency Department (HOSPITAL_BASED_OUTPATIENT_CLINIC_OR_DEPARTMENT_OTHER)
Admission: EM | Admit: 2020-08-13 | Discharge: 2020-08-13 | Disposition: A | Discharge status: Home / Self Care | Payer: Self-pay | Attending: Emergency Medicine | Admitting: Emergency Medicine

## 2020-08-13 DIAGNOSIS — R21 Rash and other nonspecific skin eruption: Secondary | ICD-10-CM | POA: Insufficient documentation

## 2020-08-13 MED ORDER — HYDROCORTISONE 1 % EX CREA: TOPICAL_CREAM | CUTANEOUS | 0 refills | Status: DC

## 2020-08-13 NOTE — ED Triage Notes (Signed)
C/o rash x 4 days

## 2020-08-13 NOTE — ED Provider Notes (Signed)
[facility] EMERGENCY DEPARTMENT Provider Note   CSN: [id number] Arrival date & time: 08/13/20  1314     History Chief Complaint  Patient presents with  . Rash    Maria Barber is a 29 y.o. female.  HPI Patient is a 29 year old female that presents to the emergency department due to a rash.  This started about 2 days ago and started on the right lower extremity.  Now she notes papules across her chest and upper extremities.  No similar symptoms with others in the household.  She states they are itchy.  Nonpurulent.  No other complaints at this time.    Past Medical History:  Diagnosis Date  . Gastric ulcer     There are no problems to display for this patient.   History reviewed. No pertinent surgical history.   OB History   No obstetric history on file.     No family history on file.  Social History   Tobacco Use  . Smoking status: Never Smoker  . Smokeless tobacco: Never Used  Substance Use Topics  . Alcohol use: Not Currently  . Drug use: Not Currently    Home Medications Prior to Admission medications   Medication Sig Start Date End Date Taking? Authorizing Provider  ibuprofen (ADVIL) 600 MG tablet Take 1 tablet (600 mg total) by mouth every 6 (six) hours as needed. Patient not taking: Reported on 08/11/2020 08/09/20   Jacalyn Lefevre, MD  methylPREDNISolone (MEDROL DOSEPAK) 4 MG TBPK tablet Take as directed Patient taking differently: Take 4-24 mg by mouth See admin instructions. Take as directed for 6 days. 08/09/20   Jacalyn Lefevre, MD  ondansetron (ZOFRAN) 4 MG tablet Take 1 tablet (4 mg total) by mouth every 8 (eight) hours as needed for nausea or vomiting. 08/11/20   Haskel Schroeder, PA-C  pantoprazole (PROTONIX) 20 MG tablet Take 1 tablet (20 mg total) by mouth daily. 08/11/20 09/10/20  Haskel Schroeder, PA-C    Allergies    Patient has no known allergies.  Review of Systems   Review of Systems  Constitutional: Negative for  chills and fever.  Gastrointestinal: Negative for nausea and vomiting.  Skin: Positive for color change and rash.   Physical Exam Updated Vital Signs BP 126/85   Pulse (!) 101   Temp 98.3 F (36.8 C)   Resp 16   Ht 5\' 10"  (1.778 m)   Wt 90.7 kg   LMP 08/12/2020   SpO2 100%   BMI 28.70 kg/m   Physical Exam Vitals and nursing note reviewed.  Constitutional:      General: She is not in acute distress.    Appearance: She is well-developed.  HENT:     Head: Normocephalic and atraumatic.     Right Ear: External ear normal.     Left Ear: External ear normal.  Eyes:     General: No scleral icterus.       Right eye: No discharge.        Left eye: No discharge.     Conjunctiva/sclera: Conjunctivae normal.  Neck:     Trachea: No tracheal deviation.  Cardiovascular:     Rate and Rhythm: Tachycardia present.  Pulmonary:     Effort: Pulmonary effort is normal. No respiratory distress.     Breath sounds: No stridor.  Abdominal:     General: There is no distension.  Musculoskeletal:        General: No swelling or deformity.     Cervical back:  Neck supple.  Skin:    General: Skin is warm and dry.     Findings: Erythema present. No rash.     Comments: Papular erythematous pruritic rash noted across all 4 extremities as well as the torso.  Neurological:     Mental Status: She is alert.     Cranial Nerves: Cranial nerve deficit: no gross deficits.    ED Results / Procedures / Treatments   Labs (all labs ordered are listed, but only abnormal results are displayed) Labs Reviewed - No data to display  EKG None  Radiology No results found.  Procedures Procedures (including critical care time)  Medications Ordered in ED Medications - No data to display  ED Course  I have reviewed the triage vital signs and the nursing notes.  Pertinent labs & imaging results that were available during my care of the patient were reviewed by me and considered in my medical decision  making (see chart for details).    MDM Rules/Calculators/A&P                          Patient is a 29 year old female that presents to the emergency department with 2 days of diffuse pruritic rash.  Patient was seen 2 days ago with abdominal pain as well as nausea and vomiting.  This started after she received dexamethasone and began taking oral prednisone at a visit 4 days ago.  She discontinued these medications at her ER visit 2 days ago and the rash started shortly thereafter.  She was prescribed Protonix as well as Zofran at her recent ED visit but has not taken either of these medications since being discharged.  Patient does note that she has been staying at a friend's house for the past couple of days.  The friend is not having a similar rash.  Possibly could be associated to a new detergent or some type of lotion or perfume.  Given her adverse reactions to oral steroids recently, will prescribe a topical hydrocortisone.  She is amenable with this plan.  Return to the ED with worsening symptoms.  Her questions were answered and she was amicable at the time of discharge.   Final Clinical Impression(s) / ED Diagnoses Final diagnoses:  Rash    Rx / DC Orders ED Discharge Orders         Ordered    hydrocortisone cream 1 %        08/13/20 1549           Placido Sou, PA-C 08/13/20 1549    Arby Barrette, MD 08/21/20 [phone]

## 2020-08-13 NOTE — Discharge Instructions (Signed)
Like we discussed, I am prescribing you hydrocortisone cream.  You can apply this to the rash 1-2 times per day which should help with the inflammation as well as itching.  If your symptoms worsen you can return to the emergency department for reevaluation.  It was a pleasure to meet you.

## 2020-08-13 NOTE — ED Notes (Signed)
ED Provider at bedside. 

## 2020-08-13 NOTE — ED Notes (Signed)
Red, elevated rashes to her torso, BLE and BUE.  Patient stated that it is getting worst.  Rashes started after she got the injection at [facility].  She is not sure as to what medicine it was.

## 2020-08-31 ENCOUNTER — Encounter (HOSPITAL_COMMUNITY): Payer: Self-pay

## 2020-08-31 ENCOUNTER — Other Ambulatory Visit: Payer: Self-pay

## 2020-08-31 ENCOUNTER — Emergency Department (HOSPITAL_COMMUNITY)
Admission: EM | Admit: 2020-08-31 | Discharge: 2020-08-31 | Disposition: A | Discharge status: Home / Self Care | Payer: Self-pay | Attending: Emergency Medicine | Admitting: Emergency Medicine

## 2020-08-31 DIAGNOSIS — L509 Urticaria, unspecified: Secondary | ICD-10-CM | POA: Insufficient documentation

## 2020-08-31 MED ORDER — TRIAMCINOLONE ACETONIDE 0.1 % EX CREA: 1.0000 "application " | TOPICAL_CREAM | Freq: Two times a day (BID) | CUTANEOUS | 0 refills | Status: DC

## 2020-08-31 NOTE — ED Provider Notes (Signed)
[facility] Provider Note  CSN: [id number] Arrival date & time: 08/31/20 2585    History Chief Complaint  Patient presents with  . Rash    HPI  Maria Barber is a 29 y.o. female here for re-evaluation of rash. She has had urticarial type rash off and on recently. Was initially in the ED for shoulder pain, diagnosed with tendonitis and had steroid injection, then broke out in hives and given oral steroids which exacerbated her gastric ulcer. She was switched to topical steroids a few weeks ago which resolved the rash but it came back last night mostly on her upper extremities this time. She denies any new exposures to soaps, lotions, or detergents.    Past Medical History:  Diagnosis Date  . Gastric ulcer     History reviewed. No pertinent surgical history.  History reviewed. No pertinent family history.  Social History   Tobacco Use  . Smoking status: Never Smoker  . Smokeless tobacco: Never Used  Substance Use Topics  . Alcohol use: Not Currently  . Drug use: Not Currently     Home Medications Prior to Admission medications   Medication Sig Start Date End Date Taking? Authorizing Provider  ondansetron (ZOFRAN) 4 MG tablet Take 1 tablet (4 mg total) by mouth every 8 (eight) hours as needed for nausea or vomiting. 08/11/20   Haskel Schroeder, PA-C  pantoprazole (PROTONIX) 20 MG tablet Take 1 tablet (20 mg total) by mouth daily. 08/11/20 09/10/20  Haskel Schroeder, PA-C  triamcinolone (KENALOG) 0.1 % Apply 1 application topically 2 (two) times daily. 08/31/20   Pollyann Savoy, MD     Allergies    Patient has no known allergies.   Review of Systems   Review of Systems A comprehensive review of systems was completed and negative except as noted in HPI.    Physical Exam BP (!) 154/89 (BP Location: Right Arm)   Pulse 84   Temp 98.1 F (36.7 C) (Oral)   Resp 18   Ht 5\' 2"  (1.575 m)   Wt 90.7 kg   LMP 08/12/2020   SpO2 100%    BMI 36.58 kg/m   Physical Exam Vitals and nursing note reviewed.  HENT:     Head: Normocephalic.     Nose: Nose normal.  Eyes:     Extraocular Movements: Extraocular movements intact.  Pulmonary:     Effort: Pulmonary effort is normal.     Breath sounds: No stridor.  Musculoskeletal:        General: Normal range of motion.     Cervical back: Neck supple.  Skin:    Findings: Rash (urticarial rash to upper extremities, none on torso) present.  Neurological:     Mental Status: She is alert and oriented to person, place, and time.  Psychiatric:        Mood and Affect: Mood normal.      ED Results / Procedures / Treatments   Labs (all labs ordered are listed, but only abnormal results are displayed) Labs Reviewed - No data to display  EKG None  Radiology No results found.  Procedures Procedures  Medications Ordered in the ED Medications - No data to display   MDM Rules/Calculators/A&P MDM Plan topical steroids as this helped previously. Referral to Allergy for further evaluation of cause.  ED Course  I have reviewed the triage vital signs and the nursing notes.  Pertinent labs & imaging results that were available during my care of the  patient were reviewed by me and considered in my medical decision making (see chart for details).     Final Clinical Impression(s) / ED Diagnoses Final diagnoses:  Urticaria    Rx / DC Orders ED Discharge Orders         Ordered    triamcinolone (KENALOG) 0.1 %  2 times daily        08/31/20 1120           Pollyann Savoy, MD 08/31/20 1120

## 2020-08-31 NOTE — ED Triage Notes (Signed)
Pt arrived via walk in, c/o rash to bilateral arms, and some spots on legs that started this morning. Denies any new products/foods or environment.

## 2021-05-05 ENCOUNTER — Other Ambulatory Visit: Payer: Self-pay

## 2021-05-05 ENCOUNTER — Encounter (HOSPITAL_BASED_OUTPATIENT_CLINIC_OR_DEPARTMENT_OTHER): Payer: Self-pay | Admitting: Urology

## 2021-05-05 ENCOUNTER — Emergency Department (HOSPITAL_BASED_OUTPATIENT_CLINIC_OR_DEPARTMENT_OTHER)
Admission: EM | Admit: 2021-05-05 | Discharge: 2021-05-05 | Disposition: A | Discharge status: Home / Self Care | Payer: BC Managed Care – PPO | Attending: Emergency Medicine | Admitting: Emergency Medicine

## 2021-05-05 DIAGNOSIS — N939 Abnormal uterine and vaginal bleeding, unspecified: Secondary | ICD-10-CM | POA: Diagnosis not present

## 2021-05-05 LAB — CBC WITH DIFFERENTIAL/PLATELET
Abs Immature Granulocytes: 0.02 10*3/uL (ref 0.00–0.07)
Basophils Absolute: 0 10*3/uL (ref 0.0–0.1)
Basophils Relative: 0 %
Eosinophils Absolute: 0.1 10*3/uL (ref 0.0–0.5)
Eosinophils Relative: 1 %
HCT: 40.6 % (ref 36.0–46.0)
Hemoglobin: 13.4 g/dL (ref 12.0–15.0)
Immature Granulocytes: 0 %
Lymphocytes Relative: 28 %
Lymphs Abs: 2.5 10*3/uL (ref 0.7–4.0)
MCH: 31.5 pg (ref 26.0–34.0)
MCHC: 33 g/dL (ref 30.0–36.0)
MCV: 95.3 fL (ref 80.0–100.0)
Monocytes Absolute: 0.5 10*3/uL (ref 0.1–1.0)
Monocytes Relative: 6 %
Neutro Abs: 5.7 10*3/uL (ref 1.7–7.7)
Neutrophils Relative %: 65 %
Platelets: 310 10*3/uL (ref 150–400)
RBC: 4.26 MIL/uL (ref 3.87–5.11)
RDW: 12.1 % (ref 11.5–15.5)
WBC: 8.9 10*3/uL (ref 4.0–10.5)
nRBC: 0 % (ref 0.0–0.2)

## 2021-05-05 LAB — URINALYSIS, ROUTINE W REFLEX MICROSCOPIC
Bilirubin Urine: NEGATIVE
Glucose, UA: NEGATIVE mg/dL
Ketones, ur: NEGATIVE mg/dL
Leukocytes,Ua: NEGATIVE
Nitrite: NEGATIVE
Protein, ur: NEGATIVE mg/dL
Specific Gravity, Urine: 1.03 (ref 1.005–1.030)
pH: 5 (ref 5.0–8.0)

## 2021-05-05 LAB — WET PREP, GENITAL
Clue Cells Wet Prep HPF POC: NONE SEEN
Sperm: NONE SEEN
Trich, Wet Prep: NONE SEEN
Yeast Wet Prep HPF POC: NONE SEEN

## 2021-05-05 LAB — URINALYSIS, MICROSCOPIC (REFLEX)

## 2021-05-05 LAB — HIV ANTIBODY (ROUTINE TESTING W REFLEX): HIV Screen 4th Generation wRfx: NONREACTIVE

## 2021-05-05 LAB — PREGNANCY, URINE: Preg Test, Ur: NEGATIVE

## 2021-05-05 NOTE — Discharge Instructions (Addendum)
Please follow up with [facility] for further evaluation of your bleeding  We have tested you for gonorrhea, chlamydia, HIV, and syphilis today. These results will return in about 2-3 days time. Please refrain from intercourse until you receive your results. We will call you if you test positive. You can also log into MyChart and check the results that way.   Return to the ED for any new/worsening symptoms including new abdominal/pelvic pain, worsening bleeding, feelings of lightheadedness/dizziness, chest pain, shortness of breath.

## 2021-05-05 NOTE — ED Triage Notes (Signed)
Finished menstrual cycle last week, started having vaginal bleeding yesterday, worsening today, denies any pain at this time.  States 1 pad every hour.

## 2021-05-05 NOTE — ED Provider Notes (Signed)
[facility] EMERGENCY DEPARTMENT Provider Note   CSN: [id number] Arrival date & time: 05/05/21  1355     History Chief Complaint  Patient presents with   Vaginal Bleeding    Maria Barber is a 30 y.o. female who presents to the ED Today with complaint of vaginal bleeding that began yesterday. She reports she had her regular menstrual cycle on 08/07 that lasted about 1 week. She began bleeding again yesterday very lightly however worse today. She states she has gone through 3 thin pads over the last hour. She has not increased the size of her pad. She denies any abdominal pain. She reports she has chronic nausea related to gastric ulcer however it has been worse over the past 2 days. No vomiting. She is sexually active with female partner. Denies chance of pregnancy. She is not on birth control. She reports she has never had a papsmear.   The history is provided by the patient and medical records.      Past Medical History:  Diagnosis Date   Gastric ulcer     There are no problems to display for this patient.   History reviewed. No pertinent surgical history.   OB History     Gravida  0   Para  0   Term  0   Preterm  0   AB  0   Living  0      SAB  0   IAB  0   Ectopic  0   Multiple  0   Live Births  0           History reviewed. No pertinent family history.  Social History   Tobacco Use   Smoking status: Never    Passive exposure: Never   Smokeless tobacco: Never  Substance Use Topics   Alcohol use: Not Currently   Drug use: Not Currently    Home Medications Prior to Admission medications   Medication Sig Start Date End Date Taking? Authorizing Provider  ondansetron (ZOFRAN) 4 MG tablet Take 1 tablet (4 mg total) by mouth every 8 (eight) hours as needed for nausea or vomiting. 08/11/20   Haskel Schroeder, PA-C  pantoprazole (PROTONIX) 20 MG tablet Take 1 tablet (20 mg total) by mouth daily. 08/11/20 09/10/20  Haskel Schroeder, PA-C  triamcinolone (KENALOG) 0.1 % Apply 1 application topically 2 (two) times daily. 08/31/20   Pollyann Savoy, MD    Allergies    Prednisone  Review of Systems   Review of Systems  Constitutional:  Negative for chills and fever.  Gastrointestinal:  Positive for nausea. Negative for abdominal pain and vomiting.  Genitourinary:  Positive for vaginal bleeding. Negative for pelvic pain and vaginal discharge.  All other systems reviewed and are negative.  Physical Exam Updated Vital Signs BP 135/65 (BP Location: Right Arm)   Pulse 81   Temp 98.3 F (36.8 C) (Oral)   Resp 18   Ht 5\' 4"  (1.626 m)   Wt 90.7 kg   LMP 04/18/2021 (Exact Date)   SpO2 100%   BMI 34.33 kg/m   Physical Exam Vitals and nursing note reviewed.  Constitutional:      Appearance: She is obese. She is not ill-appearing or diaphoretic.  HENT:     Head: Normocephalic and atraumatic.  Eyes:     Conjunctiva/sclera: Conjunctivae normal.  Cardiovascular:     Rate and Rhythm: Normal rate and regular rhythm.  Pulmonary:     Effort: Pulmonary effort  is normal.     Breath sounds: Normal breath sounds. No wheezing, rhonchi or rales.  Abdominal:     Palpations: Abdomen is soft.     Tenderness: There is no abdominal tenderness. There is no guarding or rebound.  Genitourinary:    Comments: Chaperone present for exam Cheryll Dessert, RN No rashes, lesions, or tenderness to external genitalia. Pt with significant tenderness to introitus. Very small amount of blood in vault. No adnexal masses, tenderness, or fullness. No CMT, cervical friability, or discharge from cervical os. Cervical os is closed. Uterus non-deviated, mobile, nonTTP, and without enlargement.  Musculoskeletal:     Cervical back: Neck supple.  Skin:    General: Skin is warm and dry.  Neurological:     Mental Status: She is alert.    ED Results / Procedures / Treatments   Labs (all labs ordered are listed, but only abnormal  results are displayed) Labs Reviewed  WET PREP, GENITAL - Abnormal; Notable for the following components:      Result Value   WBC, Wet Prep HPF POC FEW (*)    All other components within normal limits  URINALYSIS, ROUTINE W REFLEX MICROSCOPIC - Abnormal; Notable for the following components:   Hgb urine dipstick LARGE (*)    All other components within normal limits  URINALYSIS, MICROSCOPIC (REFLEX) - Abnormal; Notable for the following components:   Bacteria, UA MANY (*)    All other components within normal limits  CBC WITH DIFFERENTIAL/PLATELET  PREGNANCY, URINE  RPR  HIV ANTIBODY (ROUTINE TESTING W REFLEX)  GC/CHLAMYDIA PROBE AMP ([place]) NOT AT [facility]    EKG None  Radiology No results found.  Procedures Procedures   Medications Ordered in ED Medications - No data to display  ED Course  I have reviewed the triage vital signs and the nursing notes.  Pertinent labs & imaging results that were available during my care of the patient were reviewed by me and considered in my medical decision making (see chart for details).    MDM Rules/Calculators/A&P                           30 year old female presenting to the ED today with vaginal bleeding that began yesterday. LNMP 08/07. PT does not typically have irregular periods. She is sexually active with female partner; denies risk of pregnancy. On arrival to the ED VSS. Pt is comfortable appearing. Will plan for pelvic exam at this time to assess for amount of bleeding. Pt would also like STI testing while she is here including HIV/RPR testing. Will plan to check hgb as well with CBC. Given pt is not having any pain I have low suspicion for ruptured ovarian cyst. Question fibroids causing AUB? Pt does not have an OBGYN to follow up with currently however will likely need one for further eval of irregular bleeding. Given she is only sexually active with females very low suspicion for pregnancy including ectopic. May consider  ultrasound if pt has adnexal pain on pelvic exam.   Pelvic exam performed with chaperone at bedside. Pt with significant TTP to introitus during exam. There are no lesions or overlying skin changes appreciated externally to account for pain. Pt has not had a pelvic exam in the past, question if this is why she has pain? She does not have any pain just laying there. There is no adnexal or cervical motion TTP on exam. I do not feel pt requires  ultrasound at this time given she is only having pain to her external mucosa.   CBC with stable hgb at 13.4 U/A with large hgb on dipstick and 6-10 RBCs with many  bacteria. No leuks, nitrites, and 0-5 WBCs. No concern for infection.  UPT negative Wet prep with few bacteria however negative for yeast, clue cells, and trich  Workup overall reassuring at this time. Will plan for discharge and outpatient OBGYN follow up. Pt will need a papsmear to assess for any abnormal cells that could be causing small amount of bleeding. She may also need outpatient ultrasound. I do not feel she requires emergent ultrasound at this time. Pt in agreement with plan and stable for discharge home.   This note was prepared using Dragon voice recognition software and may include unintentional dictation errors due to the inherent limitations of voice recognition software.   Final Clinical Impression(s) / ED Diagnoses Final diagnoses:  Abnormal uterine bleeding    Rx / DC Orders ED Discharge Orders     None        Discharge Instructions      Please follow up with [facility] for further evaluation of your bleeding  We have tested you for gonorrhea, chlamydia, HIV, and syphilis today. These results will return in about 2-3 days time. Please refrain from intercourse until you receive your results. We will call you if you test positive. You can also log into MyChart and check the results that way.   Return to the ED for any new/worsening symptoms  including new abdominal/pelvic pain, worsening bleeding, feelings of lightheadedness/dizziness, chest pain, shortness of breath.        Tanda Rockers, PA-C 05/05/21 1533    Milagros Loll, MD 05/06/21 [phone]

## 2021-05-06 LAB — GC/CHLAMYDIA PROBE AMP (~~LOC~~) NOT AT ARMC
Chlamydia: NEGATIVE
Comment: NEGATIVE
Comment: NORMAL
Neisseria Gonorrhea: NEGATIVE

## 2021-05-06 LAB — RPR: RPR Ser Ql: NONREACTIVE

## 2022-05-24 IMAGING — DX DG SHOULDER 2+V*L*
2 series · 2 of 2 positions shown · non-contrast
Comparison: None.

CLINICAL DATA: Left shoulder pain

EXAM:
LEFT SHOULDER - 2+ VIEW

[shoulder grashey]
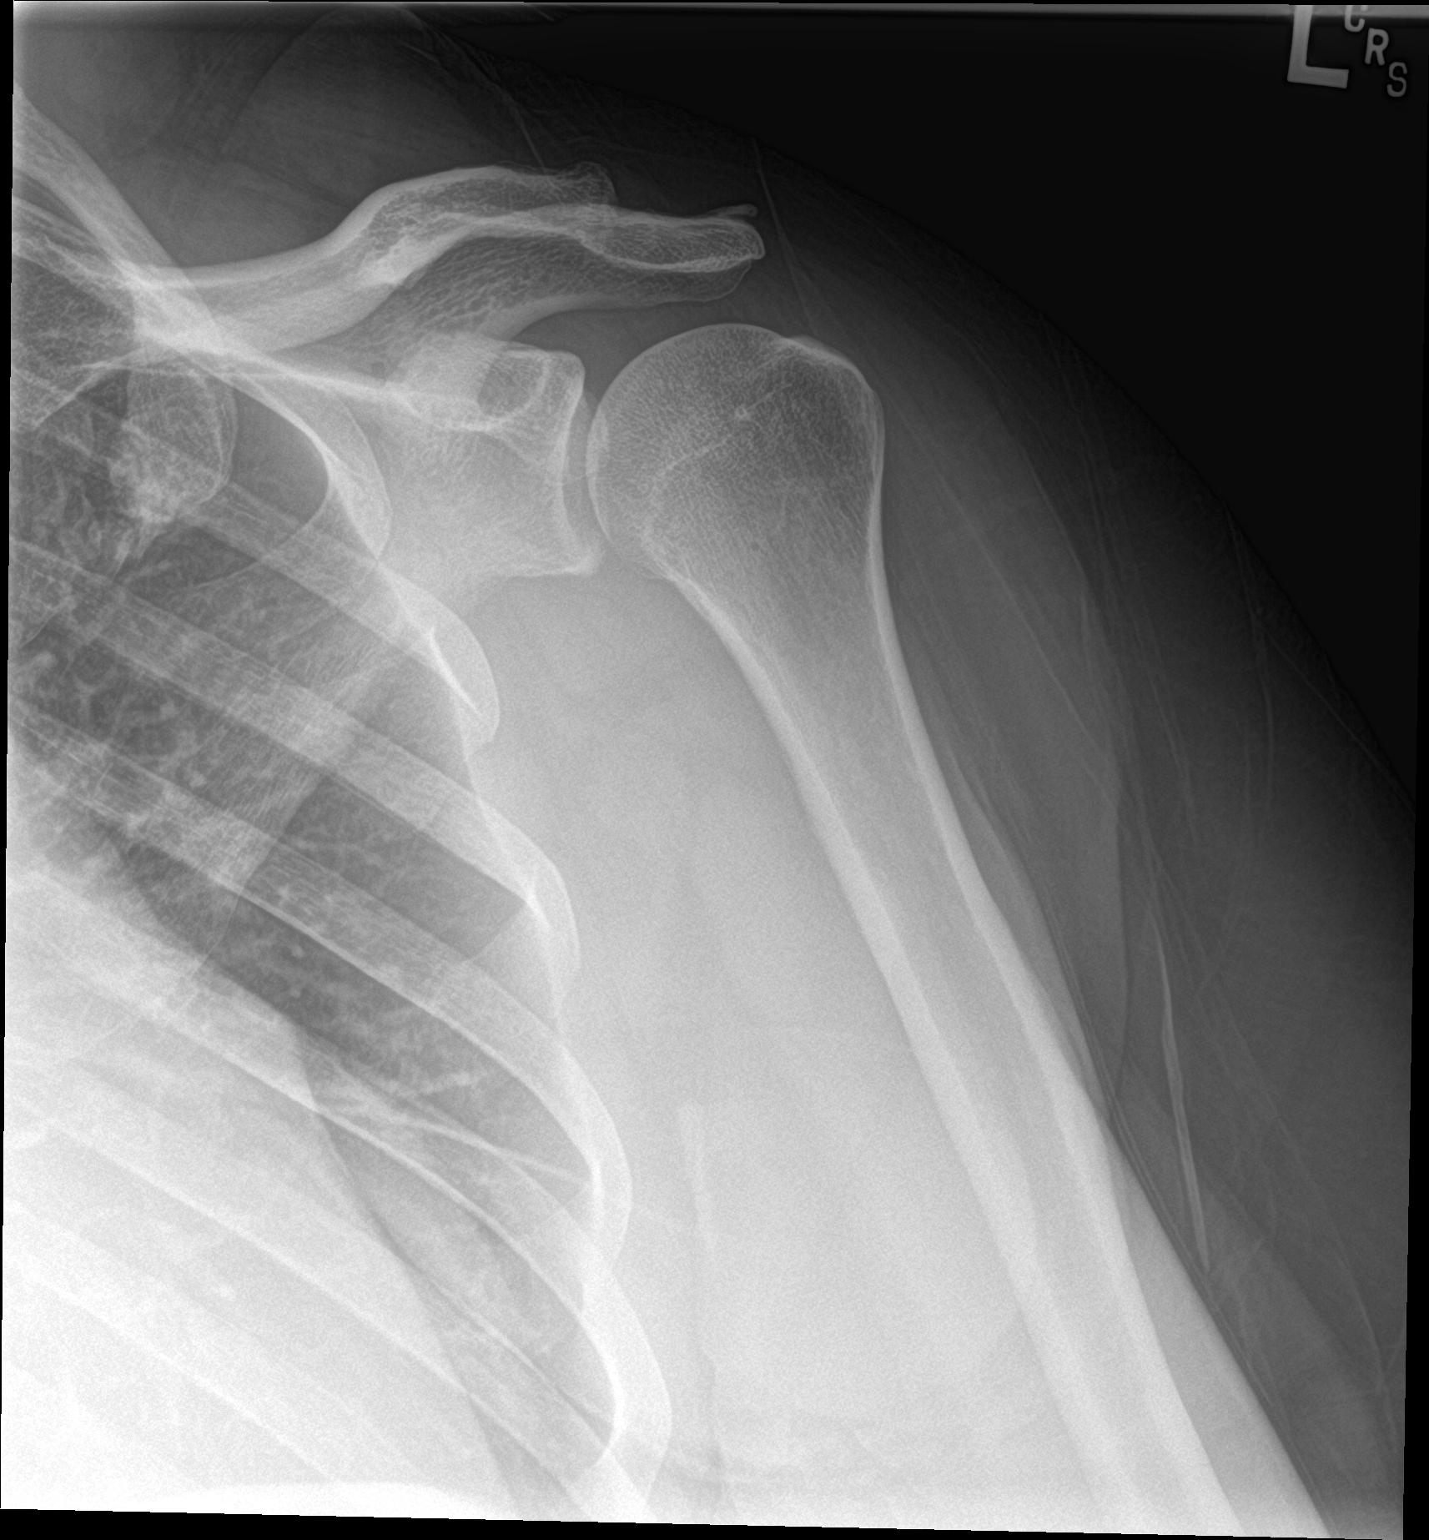

[shoulder y view]
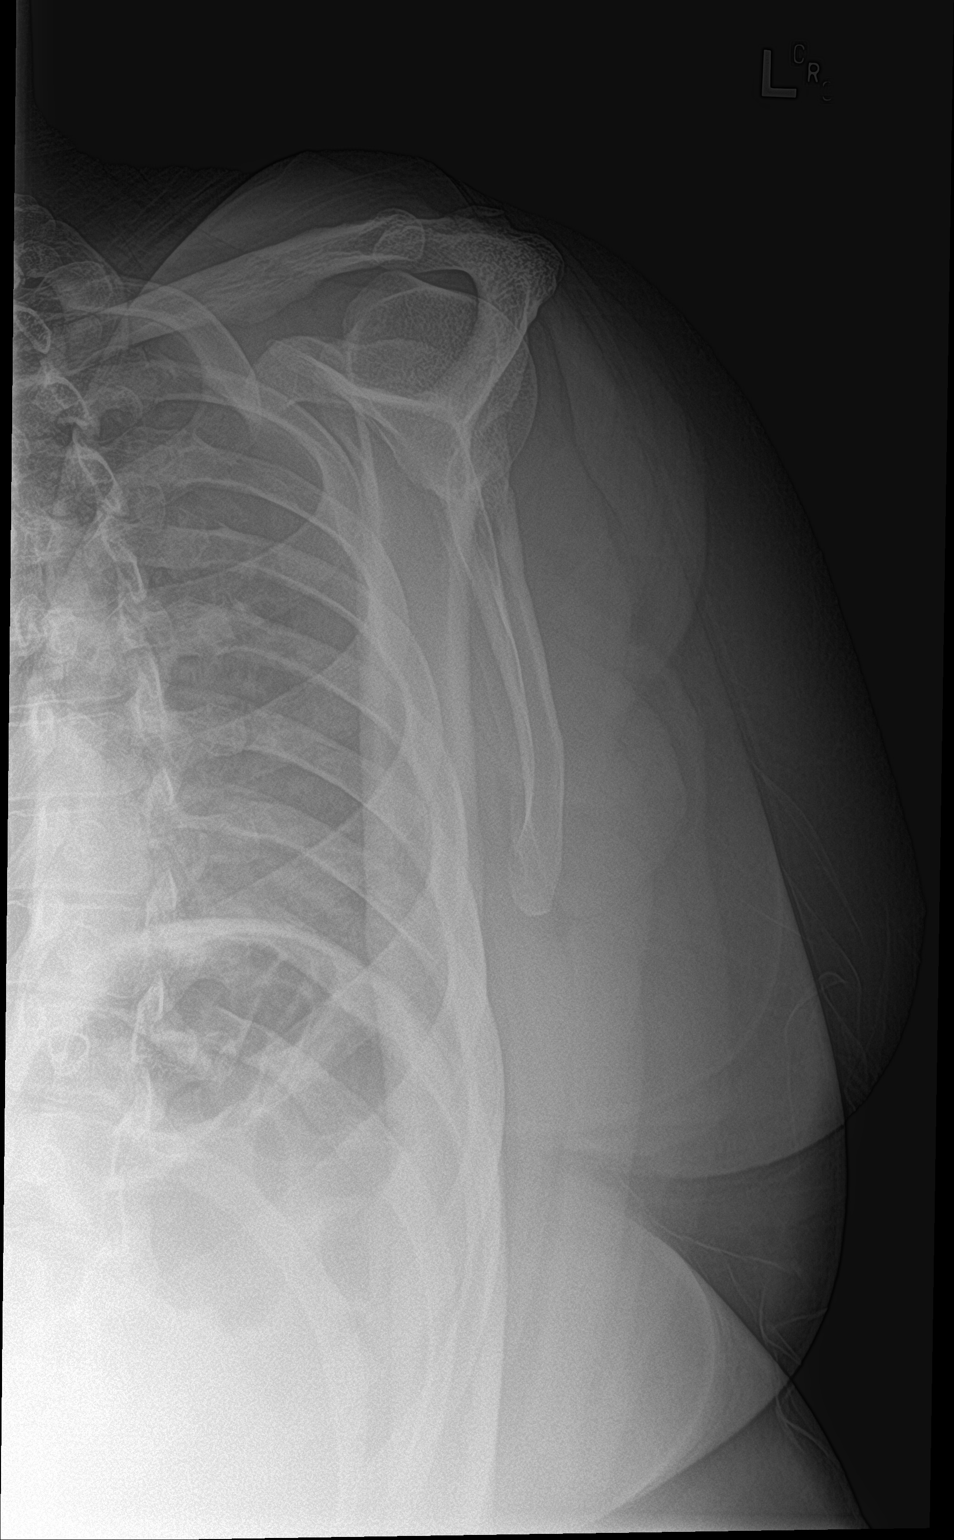

[2 of 2 positions shown; findings below may reference images not displayed]

FINDINGS: There is no evidence of fracture or dislocation. There is no
evidence of arthropathy or other focal bone abnormality. Soft
tissues are unremarkable.
IMPRESSION: Negative.

## 2022-09-25 ENCOUNTER — Other Ambulatory Visit: Payer: Self-pay

## 2022-09-25 ENCOUNTER — Emergency Department (HOSPITAL_BASED_OUTPATIENT_CLINIC_OR_DEPARTMENT_OTHER)
Admission: EM | Admit: 2022-09-25 | Discharge: 2022-09-25 | Disposition: A | Discharge status: Home / Self Care | Payer: BLUE CROSS/BLUE SHIELD | Attending: Emergency Medicine | Admitting: Emergency Medicine

## 2022-09-25 ENCOUNTER — Encounter (HOSPITAL_BASED_OUTPATIENT_CLINIC_OR_DEPARTMENT_OTHER): Payer: Self-pay | Admitting: Emergency Medicine

## 2022-09-25 DIAGNOSIS — J069 Acute upper respiratory infection, unspecified: Secondary | ICD-10-CM | POA: Insufficient documentation

## 2022-09-25 DIAGNOSIS — R059 Cough, unspecified: Secondary | ICD-10-CM | POA: Diagnosis present

## 2022-09-25 DIAGNOSIS — Z20822 Contact with and (suspected) exposure to covid-19: Secondary | ICD-10-CM | POA: Diagnosis not present

## 2022-09-25 LAB — RESP PANEL BY RT-PCR (RSV, FLU A&B, COVID)  RVPGX2
Influenza A by PCR: NEGATIVE
Influenza B by PCR: NEGATIVE
Resp Syncytial Virus by PCR: NEGATIVE
SARS Coronavirus 2 by RT PCR: NEGATIVE

## 2022-09-25 LAB — GROUP A STREP BY PCR: Group A Strep by PCR: NOT DETECTED

## 2022-09-25 NOTE — ED Triage Notes (Signed)
  Patient comes in with cough and sore throat that has been going on for 2 days.  Patient states she has been taking cough/cold medicine but it doesn't help much.  No sick contacts.  No fever at home.  Pain 8/10, sore throat.

## 2022-09-25 NOTE — ED Provider Notes (Signed)
[place] EMERGENCY DEPT  Provider Note  CSN: [id number] Arrival date & time: 09/25/22 0353  History Chief Complaint  Patient presents with   Cough   Sore Throat    Maria Barber is a 32 y.o. female with no significant PMH reports 2 days of sore throat, congestion, cough and R ear pain. Some improvement with OTC meds but symptoms return. No fever.    Home Medications Prior to Admission medications   Medication Sig Start Date End Date Taking? Authorizing Provider  ondansetron (ZOFRAN) 4 MG tablet Take 1 tablet (4 mg total) by mouth every 8 (eight) hours as needed for nausea or vomiting. 08/11/20   Loni Beckwith, PA-C  pantoprazole (PROTONIX) 20 MG tablet Take 1 tablet (20 mg total) by mouth daily. 08/11/20 09/10/20  Loni Beckwith, PA-C  triamcinolone (KENALOG) 0.1 % Apply 1 application topically 2 (two) times daily. 08/31/20   Truddie Hidden, MD     Allergies    Prednisone   Review of Systems   Review of Systems Please see HPI for pertinent positives and negatives  Physical Exam BP (!) 134/99 (BP Location: Right Arm)   Pulse 90   Temp 98.1 F (36.7 C) (Oral)   Resp 20   Ht 5\' 4"  (1.626 m)   Wt 90.7 kg   LMP 09/18/2022   SpO2 100%   BMI 34.33 kg/m   Physical Exam Vitals and nursing note reviewed.  Constitutional:      Appearance: Normal appearance.  HENT:     Head: Normocephalic and atraumatic.     Right Ear: Tympanic membrane normal.     Left Ear: Tympanic membrane normal.     Nose: Nose normal.     Mouth/Throat:     Mouth: Mucous membranes are moist. No oral lesions.     Pharynx: No pharyngeal swelling or oropharyngeal exudate.     Tonsils: No tonsillar exudate.  Eyes:     Extraocular Movements: Extraocular movements intact.     Conjunctiva/sclera: Conjunctivae normal.  Cardiovascular:     Rate and Rhythm: Normal rate.  Pulmonary:     Effort: Pulmonary effort is normal.     Breath sounds: Normal breath sounds.   Abdominal:     General: Abdomen is flat.     Palpations: Abdomen is soft.     Tenderness: There is no abdominal tenderness.  Musculoskeletal:        General: No swelling. Normal range of motion.     Cervical back: Neck supple.  Lymphadenopathy:     Cervical: No cervical adenopathy.  Skin:    General: Skin is warm and dry.  Neurological:     General: No focal deficit present.     Mental Status: She is alert.  Psychiatric:        Mood and Affect: Mood normal.     ED Results / Procedures / Treatments   EKG None  Procedures Procedures  Medications Ordered in the ED Medications - No data to display  Initial Impression and Plan  Patient here with viral URI symptoms. Strep is neg. Covid/Flu/RSV swab is neg. Recommend continued supportive care at home. PCP follow up, RTED for any other concerns.    ED Course       MDM Rules/Calculators/A&P Medical Decision Making Problems Addressed: Viral URI with cough: acute illness or injury  Amount and/or Complexity of Data Reviewed Labs: ordered. Decision-making details documented in ED Course.  Risk OTC drugs.    Final Clinical Impression(s) /  ED Diagnoses Final diagnoses:  Viral URI with cough    Rx / DC Orders ED Discharge Orders     None        Truddie Hidden, MD 09/25/22 [phone]

## 2023-09-19 ENCOUNTER — Encounter: Payer: BC Managed Care – PPO | Admitting: Family Medicine

## 2023-10-17 ENCOUNTER — Ambulatory Visit: Payer: 59 | Admitting: Family Medicine

## 2023-10-17 VITALS — BP 130/88 | HR 81 | Temp 97.7°F | Resp 18 | Ht 64.0 in | Wt 309.6 lb

## 2023-10-17 DIAGNOSIS — Z1159 Encounter for screening for other viral diseases: Secondary | ICD-10-CM

## 2023-10-17 DIAGNOSIS — N926 Irregular menstruation, unspecified: Secondary | ICD-10-CM

## 2023-10-17 DIAGNOSIS — R110 Nausea: Secondary | ICD-10-CM

## 2023-10-17 DIAGNOSIS — Z0000 Encounter for general adult medical examination without abnormal findings: Secondary | ICD-10-CM | POA: Diagnosis not present

## 2023-10-17 DIAGNOSIS — Z23 Encounter for immunization: Secondary | ICD-10-CM

## 2023-10-17 DIAGNOSIS — Z8711 Personal history of peptic ulcer disease: Secondary | ICD-10-CM

## 2023-10-17 LAB — LIPID PANEL
Cholesterol: 177 mg/dL (ref 0–200)
HDL: 41.2 mg/dL (ref >39.00)
LDL Cholesterol: 125 mg/dL — ABNORMAL HIGH (ref 0–99)
NonHDL: 136.12
Total CHOL/HDL Ratio: 4
Triglycerides: 55 mg/dL (ref 0.0–149.0)
VLDL: 11 mg/dL (ref 0.0–40.0)

## 2023-10-17 LAB — COMPREHENSIVE METABOLIC PANEL
ALT: 16 U/L (ref 0–35)
AST: 16 U/L (ref 0–37)
Albumin: 4 g/dL (ref 3.5–5.2)
Alkaline Phosphatase: 48 U/L (ref 39–117)
BUN: 12 mg/dL (ref 6–23)
CO2: 26 meq/L (ref 19–32)
Calcium: 8.9 mg/dL (ref 8.4–10.5)
Chloride: 106 meq/L (ref 96–112)
Creatinine, Ser: 0.77 mg/dL (ref 0.40–1.20)
GFR: 101.91 mL/min (ref >60.00)
Glucose, Bld: 87 mg/dL (ref 70–99)
Potassium: 4.5 meq/L (ref 3.5–5.1)
Sodium: 139 meq/L (ref 135–145)
Total Bilirubin: 0.4 mg/dL (ref 0.2–1.2)
Total Protein: 6.6 g/dL (ref 6.0–8.3)

## 2023-10-17 LAB — TSH: TSH: 0.74 u[IU]/mL (ref 0.35–5.50)

## 2023-10-17 LAB — POCT URINE PREGNANCY: Preg Test, Ur: NEGATIVE

## 2023-10-17 LAB — CBC WITH DIFFERENTIAL/PLATELET
Basophils Absolute: 0 10*3/uL (ref 0.0–0.1)
Basophils Relative: 0.4 % (ref 0.0–3.0)
Eosinophils Absolute: 0.1 10*3/uL (ref 0.0–0.7)
Eosinophils Relative: 1.6 % (ref 0.0–5.0)
HCT: 39.2 % (ref 36.0–46.0)
Hemoglobin: 12.9 g/dL (ref 12.0–15.0)
Lymphocytes Relative: 27.4 % (ref 12.0–46.0)
Lymphs Abs: 1.7 10*3/uL (ref 0.7–4.0)
MCHC: 32.8 g/dL (ref 30.0–36.0)
MCV: 94.7 fL (ref 78.0–100.0)
Monocytes Absolute: 0.4 10*3/uL (ref 0.1–1.0)
Monocytes Relative: 6.9 % (ref 3.0–12.0)
Neutro Abs: 3.9 10*3/uL (ref 1.4–7.7)
Neutrophils Relative %: 63.7 % (ref 43.0–77.0)
Platelets: 337 10*3/uL (ref 150.0–400.0)
RBC: 4.15 Mil/uL (ref 3.87–5.11)
RDW: 13.4 % (ref 11.5–15.5)
WBC: 6.2 10*3/uL (ref 4.0–10.5)

## 2023-10-17 LAB — POC URINALSYSI DIPSTICK (AUTOMATED)
Bilirubin, UA: NEGATIVE
Blood, UA: NEGATIVE
Glucose, UA: NEGATIVE
Ketones, UA: NEGATIVE
Leukocytes, UA: NEGATIVE
Nitrite, UA: NEGATIVE
Protein, UA: NEGATIVE
Spec Grav, UA: 1.015 (ref 1.010–1.025)
Urobilinogen, UA: 0.2 U/dL
pH, UA: 6.5 (ref 5.0–8.0)

## 2023-10-17 MED ORDER — PANTOPRAZOLE SODIUM 40 MG PO TBEC: 40.0000 mg | DELAYED_RELEASE_TABLET | Freq: Every day | ORAL | 3 refills | Status: DC

## 2023-10-17 NOTE — Patient Instructions (Signed)

## 2023-10-17 NOTE — Progress Notes (Signed)
 Established Patient Office Visit  Subjective   Patient ID: Maria Barber, female    DOB: 1990-12-22  Age: 33 y.o. MRN: [id number]  Chief Complaint  Patient presents with   New Patient (Initial Visit)    HPI Discussed the use of AI scribe software for clinical note transcription with the patient, who gave verbal consent to proceed.  History of Present Illness   Maria Barber is a 33 year old female who presents for an annual physical exam.  She experienced a fall last week that aggravated a previously torn Achilles tendon, initially injured in October. She is under the care of a podiatrist, with her last appointment last month. The injury is healing, but she still experiences soreness. Surgery was not required.  She has a history of ulcers with fluctuating symptoms, including increased nausea throughout the day, primarily in the mornings. She last consulted a specialist about a year ago, who suggested a digestive issue. She is not on medication currently, having previously taken pantoprazole  but stopped due to lack of insurance and running out of the medication.  She has irregular menstrual cycles with worsening cramps over the past few years. Menstruation began at age 31, and her periods are irregular, sometimes occurring twice a month. She has not consulted a gynecologist for these issues. Her mother had gynecological issues, but there is no known family history of fibroids or endometriosis.  There is a family history of diabetes on her mother's side, with her uncle and grandmother affected. Her mother has high blood pressure. There is also a family history of colon cancer, with her grandmother and a few cousins having had it, some of whom passed away from it. She experiences constipation and irregular bowel movements but does not take any medication for it.  She smokes marijuana daily but does not use tobacco or vape. 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 She is sexually active with one female partner and has never been pregnant. She has not had any surgeries.      There are no active problems to display for this patient.  Past Medical History:  Diagnosis Date   Gastric ulcer    History reviewed. No pertinent surgical history. Social History   Tobacco Use   Smoking status: Never    Passive exposure: Never   Smokeless tobacco: Never  Vaping Use   Vaping status: Never Used  Substance Use Topics   Alcohol use: Not Currently   Drug use: Yes    Frequency: 21.0 times per week    Types: Marijuana    Comment: 2-3 x a day   Social History   Socioeconomic History   Marital status: Significant Other    Spouse name: Not on file   Number of children: Not on file   Years of education: Not on file   Highest education level: Some college, no degree  Occupational History   Not on file  Tobacco Use   Smoking status: Never    Passive exposure: Never   Smokeless tobacco: Never  Vaping Use   Vaping status: Never Used  Substance and Sexual Activity   Alcohol use: Not Currently   Drug use: Yes    Frequency: 21.0 times per week    Types: Marijuana    Comment: 2-3 x a day   Sexual activity: Yes    Partners: Female    Birth control/protection: None    Comment: same sex partners  Other Topics  Concern   Not on file  Social History Narrative   Exercise--  no    Social Drivers of Health   Financial Resource Strain: Low Risk  (10/17/2023)   Overall Financial Resource Strain (CARDIA)    Difficulty of Paying Living Expenses: Not hard at all  Food Insecurity: No Food Insecurity (10/17/2023)   Hunger Vital Sign    Worried About Running Out of Food in the Last Year: Never true    Ran Out of Food in the Last Year: Never true  Transportation Needs: No Transportation Needs (10/17/2023)   PRAPARE - Administrator, Civil Service (Medical): No    Lack of Transportation (Non-Medical): No   Physical Activity: Sufficiently Active (10/17/2023)   Exercise Vital Sign    Days of Exercise per Week: 5 days    Minutes of Exercise per Session: 60 min  Stress: Stress Concern Present (10/17/2023)   [employer] of Occupational Health - Occupational Stress Questionnaire    Feeling of Stress : To some extent  Social Connections: Moderately Isolated (10/17/2023)   Social Connection and Isolation Panel [NHANES]    Frequency of Communication with Friends and Family: Three times a week    Frequency of Social Gatherings with Friends and Family: Three times a week    Attends Religious Services: 1 to 4 times per year    Active Member of Clubs or Organizations: No    Attends Engineer, Structural: Not on file    Marital Status: Never married  Catering Manager Violence: Not on file   Family Status  Relation Name Status   Mother  Alive   Father  Alive   MGM  (Not Specified)   Nutritional Therapist  Alive   Cousin  Alive  No partnership data on file   Family History  Problem Relation Age of Onset   Hypertension Mother    Cancer Maternal Grandmother        colon   Diabetes Maternal Grandmother    Diabetes Paternal Uncle    Cancer Cousin        colon   Allergies  Allergen Reactions   Prednisone Rash      Review of Systems  Constitutional:  Negative for chills, fever and malaise/fatigue.  HENT:  Negative for congestion and hearing loss.   Eyes:  Negative for blurred vision and discharge.  Respiratory:  Negative for cough, sputum production and shortness of breath.   Cardiovascular:  Negative for chest pain, palpitations and leg swelling.  Gastrointestinal:  Negative for abdominal pain, blood in stool, constipation, diarrhea, heartburn, nausea and vomiting.  Genitourinary:  Negative for dysuria, frequency, hematuria and urgency.  Musculoskeletal:  Negative for back pain, falls and myalgias.  Skin:  Negative for rash.  Neurological:  Negative for dizziness, sensory change, loss of  consciousness, weakness and headaches.  Endo/Heme/Allergies:  Negative for environmental allergies. Does not bruise/bleed easily.  Psychiatric/Behavioral:  Negative for depression and suicidal ideas. The patient is not nervous/anxious and does not have insomnia.       Objective:     BP 130/88 (BP Location: Right Arm, Patient Position: Sitting, Cuff Size: Large)   Pulse 81   Temp 97.7 F (36.5 C) (Oral)   Resp 18   Ht 5' 4 (1.626 m)   Wt (!) 309 lb 9.6 oz (140.4 kg)   LMP 10/07/2023   SpO2 99%   BMI 53.14 kg/m  BP Readings from Last 3 Encounters:  10/17/23 130/88  09/25/22 (!) 134/99  05/05/21 130/65   Wt Readings from Last 3 Encounters:  10/17/23 (!) 309 lb 9.6 oz (140.4 kg)  09/25/22 200 lb (90.7 kg)  05/05/21 200 lb (90.7 kg)   SpO2 Readings from Last 3 Encounters:  10/17/23 99%  09/25/22 100%  05/05/21 98%      Physical Exam Vitals and nursing note reviewed.  Constitutional:      General: She is not in acute distress.    Appearance: Normal appearance. She is well-developed.  HENT:     Head: Normocephalic and atraumatic.     Right Ear: Tympanic membrane, ear canal and external ear normal. There is no impacted cerumen.     Left Ear: Tympanic membrane, ear canal and external ear normal. There is no impacted cerumen.     Nose: Nose normal.     Mouth/Throat:     Mouth: Mucous membranes are moist.     Pharynx: Oropharynx is clear. No oropharyngeal exudate or posterior oropharyngeal erythema.  Eyes:     General: No scleral icterus.       Right eye: No discharge.        Left eye: No discharge.     Conjunctiva/sclera: Conjunctivae normal.     Pupils: Pupils are equal, round, and reactive to light.  Neck:     Thyroid: No thyromegaly or thyroid tenderness.     Vascular: No JVD.  Cardiovascular:     Rate and Rhythm: Normal rate and regular rhythm.     Heart sounds: Normal heart sounds. No murmur heard. Pulmonary:     Effort: Pulmonary effort is normal. No  respiratory distress.     Breath sounds: Normal breath sounds.  Abdominal:     General: Bowel sounds are normal. There is no distension.     Palpations: Abdomen is soft. There is no mass.     Tenderness: There is no abdominal tenderness. There is no guarding or rebound.  Musculoskeletal:        General: Normal range of motion.     Cervical back: Normal range of motion and neck supple.     Right lower leg: No edema.     Left lower leg: No edema.  Lymphadenopathy:     Cervical: No cervical adenopathy.  Skin:    General: Skin is warm and dry.     Findings: No erythema or rash.  Neurological:     Mental Status: She is alert and oriented to person, place, and time.     Cranial Nerves: No cranial nerve deficit.     Deep Tendon Reflexes: Reflexes are normal and symmetric.  Psychiatric:        Mood and Affect: Mood normal.        Behavior: Behavior normal.        Thought Content: Thought content normal.        Judgment: Judgment normal.      Results for orders placed or performed in visit on 10/17/23  POCT Urinalysis Dipstick (Automated)  Result Value Ref Range   Color, UA Yellow    Clarity, UA Clear    Glucose, UA Negative Negative   Bilirubin, UA Negative    Ketones, UA Negative    Spec Grav, UA 1.015 1.010 - 1.025   Blood, UA Negative    pH, UA 6.5 5.0 - 8.0   Protein, UA Negative Negative   Urobilinogen, UA 0.2 0.2 or 1.0 E.U./dL   Nitrite, UA Negative    Leukocytes, UA Negative Negative  POCT urine pregnancy  Result Value Ref Range   Preg Test, Ur Negative Negative    Last CBC Lab Results  Component Value Date   WBC 8.9 05/05/2021   HGB 13.4 05/05/2021   HCT 40.6 05/05/2021   MCV 95.3 05/05/2021   MCH 31.5 05/05/2021   RDW 12.1 05/05/2021   PLT 310 05/05/2021   Last metabolic panel Lab Results  Component Value Date   GLUCOSE 104 (H) 08/11/2020   NA 137 08/11/2020   K 4.0 08/11/2020   CL 108 08/11/2020   CO2 22 08/11/2020   BUN 15 08/11/2020    CREATININE 0.82 08/11/2020   GFRNONAA >60 08/11/2020   CALCIUM 8.5 (L) 08/11/2020   PROT 6.9 08/11/2020   ALBUMIN 3.6 08/11/2020   BILITOT 0.6 08/11/2020   ALKPHOS 36 (L) 08/11/2020   AST 20 08/11/2020   ALT 22 08/11/2020   ANIONGAP 7 08/11/2020   Last lipids No results found for: CHOL, HDL, LDLCALC, LDLDIRECT, TRIG, CHOLHDL Last hemoglobin A1c No results found for: HGBA1C Last thyroid functions No results found for: TSH, T3TOTAL, T4TOTAL, THYROIDAB Last vitamin D No results found for: 25OHVITD2, 25OHVITD3, VD25OH Last vitamin B12 and Folate No results found for: VITAMINB12, FOLATE    The ASCVD Risk score (Arnett DK, et al., 2019) failed to calculate for the following reasons:   The 2019 ASCVD risk score is only valid for ages 15 to 23    Assessment & Plan:   Problem List Items Addressed This Visit   None Visit Diagnoses       Preventative health care    -  Primary   Relevant Orders   CBC with Differential/Platelet   Comprehensive metabolic panel   Lipid panel   TSH     History of gastric ulcer       Relevant Medications   pantoprazole  (PROTONIX ) 40 MG tablet   Other Relevant Orders   Ambulatory referral to Gastroenterology     Need for influenza vaccination         Need for tetanus booster       Relevant Orders   Tdap vaccine greater than or equal to 7yo IM (Completed)     Need for hepatitis C screening test       Relevant Orders   Hepatitis C antibody     Irregular periods       Relevant Orders   US  Pelvic Complete With Transvaginal   POCT Urinalysis Dipstick (Automated) (Completed)   POCT urine pregnancy (Completed)     Nausea       Relevant Orders   Ambulatory referral to Gastroenterology   POCT Urinalysis Dipstick (Automated) (Completed)   POCT urine pregnancy (Completed)     Assessment and Plan    Annual Physical Exam Presents for an annual physical exam, having never had a Pap smear. Emphasized the importance of  regular cervical cancer screenings. Perform a physical exam and order a Pap smear.  Irregular Menstrual Cycles Reports irregular menstrual cycles with severe cramping and no prior gynecological evaluation. Family history of fibroids noted. Order a pelvic ultrasound and refer to a gynecologist.  Peptic Ulcer Disease Experiences nausea throughout the day with symptoms suggestive of dyspepsia. Previously treated for H. pylori infection with antibiotics. Restart pantoprazole  30 minutes before meals and refer to a gastroenterologist.  Constipation Reports constipation with irregular bowel movements and no current treatment. Advise dietary modifications, including increased fiber intake, and consider over-the-counter laxatives.  Achilles Tendon Tear Sustained a torn Achilles tendon from a fall in  October. Currently under podiatrist care and healing well with some residual soreness. No surgery required. Continue follow-up with the podiatrist as needed.  General Health Maintenance Due for routine vaccinations and screenings. Family history of diabetes and colon cancer. No recent tetanus vaccination. Administer flu shot and tetanus vaccine. Schedule a colonoscopy due to family history of colon cancer and monitor for signs of diabetes.  Follow-up Schedule a follow-up appointment after ultrasound results. Follow up with a gastroenterologist and gynecologist.        Return if symptoms worsen or fail to improve.    Yassir Enis R Lowne Chase, DO

## 2023-10-18 ENCOUNTER — Encounter: Payer: Self-pay | Admitting: Nurse Practitioner

## 2023-10-19 LAB — HEPATITIS C ANTIBODY: Hepatitis C Ab: NONREACTIVE

## 2023-10-23 ENCOUNTER — Encounter: Payer: Self-pay | Admitting: Gastroenterology

## 2023-10-25 ENCOUNTER — Ambulatory Visit (HOSPITAL_BASED_OUTPATIENT_CLINIC_OR_DEPARTMENT_OTHER)
Admission: RE | Admit: 2023-10-25 | Discharge: 2023-10-25 | Disposition: A | Discharge status: Home / Self Care | Payer: 59 | Source: Ambulatory Visit | Attending: Family Medicine | Admitting: Family Medicine

## 2023-10-25 DIAGNOSIS — N926 Irregular menstruation, unspecified: Secondary | ICD-10-CM | POA: Diagnosis present

## 2023-10-26 ENCOUNTER — Encounter: Payer: Self-pay | Admitting: Family Medicine

## 2023-10-26 DIAGNOSIS — N926 Irregular menstruation, unspecified: Secondary | ICD-10-CM

## 2023-10-27 NOTE — Telephone Encounter (Signed)
Was a gyn referral supposed to placed or are we doing the pap?

## 2023-12-05 ENCOUNTER — Ambulatory Visit: Payer: 59

## 2023-12-05 ENCOUNTER — Ambulatory Visit: Payer: 59 | Admitting: Gastroenterology

## 2023-12-05 ENCOUNTER — Encounter: Payer: Self-pay | Admitting: Gastroenterology

## 2023-12-05 VITALS — BP 122/70 | HR 91 | Ht 64.0 in | Wt 303.2 lb

## 2023-12-05 VITALS — BP 129/78 | HR 71 | Ht 64.0 in | Wt 303.1 lb

## 2023-12-05 DIAGNOSIS — K828 Other specified diseases of gallbladder: Secondary | ICD-10-CM

## 2023-12-05 DIAGNOSIS — R6881 Early satiety: Secondary | ICD-10-CM

## 2023-12-05 DIAGNOSIS — R110 Nausea: Secondary | ICD-10-CM | POA: Diagnosis not present

## 2023-12-05 DIAGNOSIS — K5900 Constipation, unspecified: Secondary | ICD-10-CM | POA: Diagnosis not present

## 2023-12-05 DIAGNOSIS — N946 Dysmenorrhea, unspecified: Secondary | ICD-10-CM

## 2023-12-05 DIAGNOSIS — K269 Duodenal ulcer, unspecified as acute or chronic, without hemorrhage or perforation: Secondary | ICD-10-CM

## 2023-12-05 DIAGNOSIS — Z8619 Personal history of other infectious and parasitic diseases: Secondary | ICD-10-CM

## 2023-12-05 NOTE — Patient Instructions (Addendum)
 Start taking Miralax 1 capful (17 grams) 1x / day for 1 week.   If this is not effective, increase to 1 dose 2x / day for 1 week.   If this is still not effective, increase to two capfuls (34 grams) 2x / day.   Can adjust dose as needed based on response. Can take 1/2 cap daily, skip days, or increase per day.  You have been scheduled for a gastric emptying scan at [facility] on Thursday 04/03 at 7:30am. Please arrive at least 30 minutes prior to your appointment for registration. Please make certain not to have anything to eat or drink after midnight the night before your test. Hold all stomach medications (ex: Zofran, phenergan, Reglan) 24 hours prior to your test. If you need to reschedule your appointment, please contact radiology scheduling at [phone]. _____________________________________________________________________ A gastric-emptying study measures how long it takes for food to move through your stomach. There are several ways to measure stomach emptying. In the most common test, you eat food that contains a small amount of radioactive material. A scanner that detects the movement of the radioactive material is placed over your abdomen to monitor the rate at which food leaves your stomach. This test normally takes about 4 hours to complete. _____________________________________________________________________  Your provider has ordered "Diatherix" stool testing for you. You have received a kit from our office today containing all necessary supplies to complete this test. Please carefully read the stool collection instructions provided in the kit before opening the accompanying materials. In addition, be sure there is a label providing your full name and date of birth on the "puritan opti-swab" tube that is supplied in the kit (if you do not see a label with this information on your test tube, please make [place] aware before test collection!). After completing the test, you should  secure the purtian tube into the specimen biohazard bag. The [facility] E-Req sheet (including date and time of specimen collection) should be placed into the outside pocket of the specimen biohazard bag and returned to the [place] lab (basement floor of [facility]) within 3 days of collection. Please make sure to give the specimen to a staff member at the lab. DO NOT leave the specimen on the counter.   If the specimen date and time (can be found in the upper right boxed portion of the sheet) are not filled out on the E-Req sheet, the test will NOT be performed.   _______________________________________________________  If your blood pressure at your visit was 140/90 or greater, please contact your primary care physician to follow up on this.  _______________________________________________________  If you are age 33 or older, your body mass index should be between 23-30. Your Body mass index is 52.05 kg/m. If this is out of the aforementioned range listed, please consider follow up with your Primary Care Provider.  If you are age 70 or younger, your body mass index should be between 19-25. Your Body mass index is 52.05 kg/m. If this is out of the aformentioned range listed, please consider follow up with your Primary Care Provider.   ________________________________________________________  The [place] [facility] providers would like to encourage you to use [facility] to communicate with providers for non-urgent requests or questions.  Due to long hold times on the telephone, sending your provider a message by [facility] may be a faster and more efficient way to get a response.  Please allow 48 business hours for a response.  Please remember  that this is for non-urgent requests.  _______________________________________________________

## 2023-12-05 NOTE — Progress Notes (Unsigned)
   GYNECOLOGY PROBLEM OFFICE VISIT NOTE  History:  Maria Barber is a 33 y.o. G0P0000 here today for dysmenorrhea. She states for the "past few years" she has been experiencing pain with menstrual cycles. She states this started around 33 years old.  She takes tylenol and ibuprofen with minimal relief. She reports taking tylenol 1000mg  Q4hrs regularly.  She states ibuprofen is only when tylenol not available.   She states the pain starts prior to menstrual period (3-4 days).  She reports the pain is intermittent prior, but the "first two days are the worse days."  She states the flow is not reflective of the pain she is experiencing.  She denies passing of clots. She reports some HA during menstrual cycle, but no N/V.   She has one incident of abnormal bleeding last year x one, but none since. She denies vaginal discharge.   She reports she has never taken birth control and is unsure of her interest.   She desires STD testing today.   Past Medical History:  Diagnosis Date   Gastric ulcer     Past Surgical History:  Procedure Laterality Date   NO PAST SURGERIES      The following portions of the patient's history were reviewed and updated as appropriate: allergies, current medications, past family history, past medical history, past social history, past surgical history and problem list.   Health Maintenance:  No pap on file.  Patient reports she has never had one. Normal mammogram on d/t age   Review of Systems:  Genito-Urinary ROS: negative Gastrointestinal ROS: negative Reports some constipation, but has Solicitor.  Objective:  Vitals: BP 129/78 (BP Location: Right Wrist, Patient Position: Sitting, Cuff Size: Normal)   Pulse 71   Ht 5\' 4"  (1.626 m)   Wt (!) 303 lb 1.9 oz (137.5 kg)   LMP 11/10/2023 (Exact Date)   BMI 52.03 kg/m   Physical Exam: OBGyn Exam   Labs and Imaging: No results found.  Assessment & Plan:  *** year old *** No diagnosis  found.  -*** -*** -***   Total face-to-face time with patient: {Blank single:19197::"15","25","30"} minutes   Gerrit Heck, [place] 12/05/2023 2:17 PM

## 2023-12-05 NOTE — Progress Notes (Signed)
 Agree with assessment and plan as outlined.

## 2023-12-05 NOTE — Progress Notes (Signed)
 Chief Complaint: Chronic nausea Primary GI MD: Gentry Fitz  HPI: 33 year old female history of H. pylori, duodenal ulcer, chronic nausea, and others as listed below presents for evaluation of chronic nausea  Discussed the use of AI scribe software for clinical note transcription with the patient, who gave verbal consent to proceed.  She experiences worsening nausea that persists throughout the day, beginning upon waking and exacerbated by eating, though it can occur without food intake. This has led to meal avoidance, particularly in the morning. Occasional heartburn occurs, varying with dietary choices. There is a sensation of food sitting in her stomach after eating and early satiety.  She has a history of H pylori infection and a duodenal ulcer diagnosed via endoscopy in 2020. A follow-up in 2022 for pain, nausea, and GERD included a HIDA scan, which showed gallbladder with EF of 42%, and an H pylori stool test, which was negative indicating eradication from her 2 previous infections of H. pylori. She has not undergone a gastric emptying study, which was previously suggested.  She reports constipation, with irregular bowel movements and sometimes not going at all, describing this as 'weird' and inconsistent.      PREVIOUS GI WORKUP   HIDA 06/2021 with EF 42%  H pylori stool antigen negative 05/2021  EGD 02/12/19: - Esophagus. Normal with regular Z line and without findings of esophagitis, Barrett's or stricture - Gastric. Small hiatal hernia. Otherwise normal. Random gastric biopsies obtained for H-pylori - Duodenum (bulb-D2). 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  CTAP with contrast 08/11/20: No acute abnormality or findings to account for reported symptoms.   US abdomen limited 08/12/16: No cause for the patient's  symptoms identified. No gallstones or wall thickening visualized. No sonographic Murphy sign noted by sonographer   Past Medical History:  Diagnosis Date   Gastric ulcer     Past Surgical History:  Procedure Laterality Date   NO PAST SURGERIES      No current outpatient medications on file.   No current facility-administered medications for this visit.    Allergies as of 12/05/2023 - Review Complete 12/05/2023  Allergen Reaction Noted   Prednisone Rash 05/05/2021    Family History  Problem Relation Age of Onset   Hypertension Mother    Colon cancer Maternal Aunt    Diabetes Paternal Uncle    Cancer Maternal Grandmother        colon   Diabetes Maternal Grandmother    Cancer Cousin        colon   Esophageal cancer Neg Hx    Stomach cancer Neg Hx    Pancreatic cancer Neg Hx     Social History   Socioeconomic History   Marital status: Significant Other    Spouse name: Not on file   Number of children: Not on file   Years of education: Not on file   Highest education level: Some college, no degree  Occupational History   Not on file  Tobacco Use   Smoking status: Never    Passive exposure: Never   Smokeless tobacco: Never  Vaping Use   Vaping status: Never Used  Substance and Sexual Activity   Alcohol use: Not Currently   Drug use: Yes    Types: Marijuana    Comment: daily   Sexual activity: Yes    Partners: Female    Birth control/protection: None  Comment: same sex partners  Other Topics Concern   Not on file  Social History Narrative   Exercise--  no    Social Drivers of Health   Financial Resource Strain: Low Risk  (10/17/2023)   Overall Financial Resource Strain (CARDIA)    Difficulty of Paying Living Expenses: Not hard at all  Food Insecurity: No Food Insecurity (10/17/2023)   Hunger Vital Sign    Worried About Running Out of Food in the Last Year: Never true    Ran Out of Food in the Last Year: Never true  Transportation Needs: No  Transportation Needs (10/17/2023)   PRAPARE - Administrator, Civil Service (Medical): No    Lack of Transportation (Non-Medical): No  Physical Activity: Sufficiently Active (10/17/2023)   Exercise Vital Sign    Days of Exercise per Week: 5 days    Minutes of Exercise per Session: 60 min  Stress: Stress Concern Present (10/17/2023)   [employer] of Occupational Health - Occupational Stress Questionnaire    Feeling of Stress : To some extent  Social Connections: Moderately Isolated (10/17/2023)   Social Connection and Isolation Panel [NHANES]    Frequency of Communication with Friends and Family: Three times a week    Frequency of Social Gatherings with Friends and Family: Three times a week    Attends Religious Services: 1 to 4 times per year    Active Member of Clubs or Organizations: No    Attends Engineer, structural: Not on file    Marital Status: Never married  Catering manager Violence: Not on file    Review of Systems:    Constitutional: No weight loss, fever, chills, weakness or fatigue HEENT: Eyes: No change in vision               Ears, Nose, Throat:  No change in hearing or congestion Skin: No rash or itching Cardiovascular: No chest pain, chest pressure or palpitations   Respiratory: No SOB or cough Gastrointestinal: See HPI and otherwise negative Genitourinary: No dysuria or change in urinary frequency Neurological: No headache, dizziness or syncope Musculoskeletal: No new muscle or joint pain Hematologic: No bleeding or bruising Psychiatric: No history of depression or anxiety    Physical Exam:  Vital signs: BP 122/70   Pulse 91   Ht 5\' 4"  (1.626 m)   Wt (!) 303 lb 4 oz (137.6 kg)   SpO2 98%   BMI 52.05 kg/m   Constitutional: NAD, Well developed, Well nourished, alert and cooperative Head:  Normocephalic and atraumatic. Eyes:   PEERL, EOMI. No icterus. Conjunctiva pink. Respiratory: Respirations even and unlabored. Lungs clear to  auscultation bilaterally.   No wheezes, crackles, or rhonchi.  Cardiovascular:  Regular rate and rhythm. No peripheral edema, cyanosis or pallor.  Gastrointestinal:  Soft, nondistended, nontender. No rebound or guarding. Normal bowel sounds. No appreciable masses or hepatomegaly. Rectal:  Not performed.  Msk:  Symmetrical without gross deformities. Without edema, no deformity or joint abnormality.  Neurologic:  Alert and  oriented x4;  grossly normal neurologically.  Skin:   Dry and intact without significant lesions or rashes. Psychiatric: Oriented to person, place and time. Demonstrates good judgement and reason without abnormal affect or behaviors.   RELEVANT LABS AND IMAGING: CBC    Component Value Date/Time   WBC 6.2 10/17/2023 1017   RBC 4.15 10/17/2023 1017   HGB 12.9 10/17/2023 1017   HCT 39.2 10/17/2023 1017   PLT 337.0 10/17/2023 1017   MCV  94.7 10/17/2023 1017   MCH 31.5 05/05/2021 1437   MCHC 32.8 10/17/2023 1017   RDW 13.4 10/17/2023 1017   LYMPHSABS 1.7 10/17/2023 1017   MONOABS 0.4 10/17/2023 1017   EOSABS 0.1 10/17/2023 1017   BASOSABS 0.0 10/17/2023 1017    CMP     Component Value Date/Time   NA 139 10/17/2023 1017   K 4.5 10/17/2023 1017   CL 106 10/17/2023 1017   CO2 26 10/17/2023 1017   GLUCOSE 87 10/17/2023 1017   BUN 12 10/17/2023 1017   CREATININE 0.77 10/17/2023 1017   CALCIUM 8.9 10/17/2023 1017   PROT 6.6 10/17/2023 1017   ALBUMIN 4.0 10/17/2023 1017   AST 16 10/17/2023 1017   ALT 16 10/17/2023 1017   ALKPHOS 48 10/17/2023 1017   BILITOT 0.4 10/17/2023 1017   GFRNONAA >60 08/11/2020 0720   GFRAA >60 09/11/2018 1008     Assessment/Plan:      Nausea Early satiety Chronic nausea and early satiety with previous workup including positive H. pylori s/p treatment with confirmed eradication and HIDA scan with EF of 42%.  DDx includes gastroparesis versus biliary dyskinesia versus dyspepsia - Order gastric emptying study to evaluate for  gastroparesis. - Perform H. pylori stool test to rule out recurrence. - Prescribe omeprazole 20 mg for heartburn and potential relief of nausea. - Prescribe Zofran for symptomatic relief of nausea. - If gastric emptying study is normal could consider repeat EGD and then if EGD is normal would consider referral to [facility] for biliary dyskinesia  Biliary dyskinesia Previous HIDA scan showed borderline gallbladder ejection fraction at 42%. Suboptimal function may contribute to symptoms. Further evaluation needed post-gastric emptying study. - Monitor gallbladder function based on results of gastric emptying study and other tests. - Consider cholecystectomy if symptoms persist and other causes are ruled out.  Constipation Intermittent constipation causing gastrointestinal discomfort. Miralax recommended for regulation. - Prescribe Miralax, one cap daily, to regulate bowel movements. - Advise on the use of fiber supplements like Benefiber or Metamucil.     Assigned to Dr. Adela Lank today  Boone Master, PA-C [facility] 12/05/2023, 9:11 AM  Cc: Zola Button, Grayling Congress, *

## 2023-12-06 MED ORDER — TRAMADOL HCL 50 MG PO TABS: 50.0000 mg | ORAL_TABLET | Freq: Four times a day (QID) | ORAL | 1 refills | Status: DC | PRN

## 2023-12-08 ENCOUNTER — Telehealth: Payer: Self-pay | Admitting: Gastroenterology

## 2023-12-08 NOTE — Telephone Encounter (Signed)
 Sent mychart message regarding negative h pylori stool study

## 2023-12-14 ENCOUNTER — Ambulatory Visit (HOSPITAL_COMMUNITY)
Admission: RE | Admit: 2023-12-14 | Discharge: 2023-12-14 | Disposition: A | Discharge status: Home / Self Care | Source: Ambulatory Visit | Attending: Gastroenterology | Admitting: Gastroenterology

## 2023-12-14 DIAGNOSIS — R6881 Early satiety: Secondary | ICD-10-CM | POA: Diagnosis present

## 2023-12-14 MED ORDER — TECHNETIUM TC 99M SULFUR COLLOID
1.9000 | Freq: Once | INTRAVENOUS | Status: AC
  Administered 2023-12-14: 1.9 via ORAL

## 2023-12-15 ENCOUNTER — Other Ambulatory Visit: Payer: Self-pay | Admitting: *Deleted

## 2023-12-15 ENCOUNTER — Encounter: Payer: Self-pay | Admitting: *Deleted

## 2023-12-15 DIAGNOSIS — Z8619 Personal history of other infectious and parasitic diseases: Secondary | ICD-10-CM

## 2023-12-15 DIAGNOSIS — R6881 Early satiety: Secondary | ICD-10-CM

## 2023-12-15 DIAGNOSIS — R110 Nausea: Secondary | ICD-10-CM

## 2023-12-18 ENCOUNTER — Other Ambulatory Visit: Payer: Self-pay | Admitting: *Deleted

## 2023-12-18 ENCOUNTER — Telehealth: Payer: Self-pay | Admitting: *Deleted

## 2023-12-18 DIAGNOSIS — R6881 Early satiety: Secondary | ICD-10-CM

## 2023-12-18 DIAGNOSIS — R110 Nausea: Secondary | ICD-10-CM

## 2023-12-18 DIAGNOSIS — Z8619 Personal history of other infectious and parasitic diseases: Secondary | ICD-10-CM

## 2023-12-18 NOTE — Telephone Encounter (Signed)
 I have spoken to patient to advise that unfortunately, we will have to cancel her endoscopy previously scheduled at [facility] for 12/19/23 and reschedule her appointment to be completed at the hospital. Patient verbalizes understanding.  Patient has been rescheduled to have her procedure completed at [facility] on 01/16/24 at 11:15 am, 9:45 am arrival.  Updated instructions have been sent through [facility] and patient has been advised of changed date/time/location of procedure to which she verbalizes understanding.

## 2023-12-18 NOTE — Telephone Encounter (Signed)
 Dr. Adela Lank,  This pt was scheduled with you on 4/4 for a procedure tomorrow.  Her BMI is greater than 50; her procedure will need to be performed at the hospital.  Thanks,  Cathlyn Parsons

## 2023-12-18 NOTE — Telephone Encounter (Signed)
 Thanks for catching this Jonny Ruiz - I agree   POD A RN / [facility] - can you please let the patient know, we cannot proceed with her exam tomorrow, should be cancelled, needs to be scheduled in the hospital due to her BMI / anesthesia risks.  [place]ley - FYI - BMI was > 50 for this patient

## 2023-12-19 ENCOUNTER — Encounter: Admitting: Gastroenterology

## 2023-12-21 ENCOUNTER — Encounter: Payer: Self-pay | Admitting: Gastroenterology

## 2023-12-27 ENCOUNTER — Encounter (HOSPITAL_COMMUNITY): Payer: Self-pay | Admitting: Gastroenterology

## 2023-12-27 ENCOUNTER — Other Ambulatory Visit (HOSPITAL_COMMUNITY)
Admission: RE | Admit: 2023-12-27 | Discharge: 2023-12-27 | Disposition: A | Discharge status: Home / Self Care | Source: Ambulatory Visit | Attending: Obstetrics & Gynecology | Admitting: Obstetrics & Gynecology

## 2023-12-27 ENCOUNTER — Ambulatory Visit: Admitting: Obstetrics & Gynecology

## 2023-12-27 ENCOUNTER — Other Ambulatory Visit: Payer: Self-pay

## 2023-12-27 ENCOUNTER — Encounter: Payer: Self-pay | Admitting: Obstetrics & Gynecology

## 2023-12-27 ENCOUNTER — Telehealth: Payer: Self-pay

## 2023-12-27 VITALS — BP 131/86 | HR 104 | Ht 63.0 in | Wt 301.0 lb

## 2023-12-27 DIAGNOSIS — Z01419 Encounter for gynecological examination (general) (routine) without abnormal findings: Secondary | ICD-10-CM | POA: Diagnosis present

## 2023-12-27 DIAGNOSIS — N944 Primary dysmenorrhea: Secondary | ICD-10-CM | POA: Diagnosis not present

## 2023-12-27 MED ORDER — NORETHINDRONE ACET-ETHINYL EST 1-20 MG-MCG PO TABS: 1.0000 | ORAL_TABLET | Freq: Every day | ORAL | 4 refills | Status: DC

## 2023-12-27 NOTE — Telephone Encounter (Signed)
 Patient called back and spoke to [facility] in Scheduling, let us  know she can do Monday the 21st. Patient advised she will need to arrive at 6:45 am.  Rescheduled patient with scheduling.  Sending new instructions to her MyChart

## 2023-12-27 NOTE — Progress Notes (Signed)
 PCP - Dr.   Dino Frank , Maria Barber  Cardiologist - no  PPM/ICD -  Device Orders -  Rep Notified -   Chest x-ray -  EKG -  Stress Test -  ECHO -  Cardiac Cath -   Sleep Study -  CPAP -   Fasting Blood Sugar -  Checks Blood Sugar __N/A___ times a day  Blood Thinner Instructions: Aspirin Instructions:     Activity--Able to climb a flight of stairs without CP or SOB Anesthesia review:   Patient denies shortness of breath, fever, cough and chest pain at PAT appointment   All instructions explained to the patient, with a verbal understanding of the material. Patient agrees to go over the instructions while at home for a better understanding. Patient also instructed to self quarantine after being tested for COVID-19. The opportunity to ask questions was provided.

## 2023-12-27 NOTE — Progress Notes (Signed)
 Subjective:     Maria Barber is a 33 y.o. female here for a routine exam.  Current complaints: Pt reports h/o dysmenorrhea that has gotten worse over the past years. Now the pain begins about 1 week prior to her menses. The bleeding is not heavy but, the pain is intense. She tooks Tylenol with no relief has take Tramadol prev with no relief. She has a h/o PUD so cannot take NSAIDS.      Gynecologic History Patient's last menstrual period was 12/11/2023. Contraception: none same gender relationship  Last Pap: never had Last mammogram:n/a  Obstetric History OB History  Gravida Para Term Preterm AB Living  0 0 0 0 0 0  SAB IAB Ectopic Multiple Live Births  0 0 0 0 0    The following portions of the patient's history were reviewed and updated as appropriate: allergies, current medications, past family history, past medical history, past social history, past surgical history, and problem list.  Review of Systems Pertinent items are noted in HPI.    Objective:  BP 131/86   Pulse (!) 104   Ht 5\' 3"  (1.6 m)   Wt (!) 301 lb (136.5 kg)   LMP 12/11/2023   BMI 53.32 kg/m   General Appearance:    Alert, cooperative, no distress, appears stated age  Head:    Normocephalic, without obvious abnormality, atraumatic  Eyes:    conjunctiva/corneas clear, EOM's intact, both eyes  Ears:    Normal external ear canals, both ears  Nose:   Nares normal, septum midline, mucosa normal, no drainage    or sinus tenderness  Throat:   Lips, mucosa, and tongue normal; teeth and gums normal  Neck:   Supple, symmetrical, trachea midline, no adenopathy;    thyroid:  no enlargement/tenderness/nodules  Back:     Symmetric, no curvature, ROM normal, no CVA tenderness  Lungs:     respirations unlabored  Chest Wall:    No tenderness or deformity   Heart:    Regular rate and rhythm  Breast Exam:    No tenderness, masses, or nipple abnormality  Abdomen:     Soft, non-tender, bowel sounds active all four  quadrants,    no masses, no organomegaly  Genitalia:    Normal female without lesion, discharge or tenderness     Extremities:   Extremities normal, atraumatic, no cyanosis or edema  Pulses:   2+ and symmetric all extremities  Skin:   Skin color, texture, turgor normal, no rashes or lesions     Assessment:    Healthy female exam.  Primary dysmenorrhea getting worse. Possible endometriosis. D/w tx options including OCPs, LnIUD and Myfembree   Plan:   Maria Barber was seen today for gynecologic exam.  Diagnoses and all orders for this visit:  Well female exam with routine gynecological exam -     Cytology - PAP  Primary dysmenorrhea -     norethindrone-ethinyl estradiol (LOESTRIN 1/20, 21,) 1-20 MG-MCG tablet; Take 1 tablet by mouth daily. Take continuous with a withdrawal bleed every 3 months   F/u in 3 months or sooner prn  Jayon Matton L. Harraway-Smith, M.D., FACOG

## 2023-12-27 NOTE — Telephone Encounter (Signed)
 Dr General Kenner has an opening at [facility] hospital  on Monday, 4-21.  Called patient who is currently scheduled for May 6th for EGD  to see if she would like to take the opening on Monday 4-21. Asked her to call back as soon as possible.

## 2024-01-01 ENCOUNTER — Encounter (HOSPITAL_COMMUNITY)
Admission: RE | Disposition: A | Discharge status: Home / Self Care | Payer: Self-pay | Source: Home / Self Care | Attending: Gastroenterology

## 2024-01-01 ENCOUNTER — Ambulatory Visit (HOSPITAL_COMMUNITY): Admitting: Certified Registered"

## 2024-01-01 ENCOUNTER — Ambulatory Visit (HOSPITAL_COMMUNITY)
Admission: RE | Admit: 2024-01-01 | Discharge: 2024-01-01 | Disposition: A | Discharge status: Home / Self Care | Attending: Gastroenterology | Admitting: Gastroenterology

## 2024-01-01 ENCOUNTER — Other Ambulatory Visit: Payer: Self-pay

## 2024-01-01 ENCOUNTER — Ambulatory Visit (HOSPITAL_BASED_OUTPATIENT_CLINIC_OR_DEPARTMENT_OTHER): Admitting: Certified Registered"

## 2024-01-01 ENCOUNTER — Encounter (HOSPITAL_COMMUNITY): Payer: Self-pay | Admitting: Gastroenterology

## 2024-01-01 DIAGNOSIS — Z6841 Body Mass Index (BMI) 40.0 and over, adult: Secondary | ICD-10-CM | POA: Diagnosis not present

## 2024-01-01 DIAGNOSIS — R1011 Right upper quadrant pain: Secondary | ICD-10-CM | POA: Diagnosis present

## 2024-01-01 DIAGNOSIS — K2970 Gastritis, unspecified, without bleeding: Secondary | ICD-10-CM

## 2024-01-01 DIAGNOSIS — R112 Nausea with vomiting, unspecified: Secondary | ICD-10-CM | POA: Diagnosis not present

## 2024-01-01 DIAGNOSIS — E6689 Other obesity not elsewhere classified: Secondary | ICD-10-CM | POA: Insufficient documentation

## 2024-01-01 DIAGNOSIS — K449 Diaphragmatic hernia without obstruction or gangrene: Secondary | ICD-10-CM

## 2024-01-01 DIAGNOSIS — R110 Nausea: Secondary | ICD-10-CM

## 2024-01-01 DIAGNOSIS — B9681 Helicobacter pylori [H. pylori] as the cause of diseases classified elsewhere: Secondary | ICD-10-CM

## 2024-01-01 DIAGNOSIS — R6881 Early satiety: Secondary | ICD-10-CM | POA: Diagnosis not present

## 2024-01-01 DIAGNOSIS — Z8619 Personal history of other infectious and parasitic diseases: Secondary | ICD-10-CM

## 2024-01-01 HISTORY — DX: Other specified health status: Z78.9

## 2024-01-01 HISTORY — PX: ESOPHAGOGASTRODUODENOSCOPY: SHX5428

## 2024-01-01 HISTORY — PX: BIOPSY OF SKIN SUBCUTANEOUS TISSUE AND/OR MUCOUS MEMBRANE: SHX6741

## 2024-01-01 LAB — CYTOLOGY - PAP
Chlamydia: NEGATIVE
Comment: NEGATIVE
Comment: NEGATIVE
Comment: NORMAL
Diagnosis: NEGATIVE
High risk HPV: NEGATIVE
Neisseria Gonorrhea: NEGATIVE

## 2024-01-01 LAB — PREGNANCY, URINE: Preg Test, Ur: NEGATIVE

## 2024-01-01 SURGERY — EGD (ESOPHAGOGASTRODUODENOSCOPY)
Anesthesia: Monitor Anesthesia Care

## 2024-01-01 MED ORDER — LIDOCAINE 2% (20 MG/ML) 5 ML SYRINGE
INTRAMUSCULAR | Status: DC | PRN
  Administered 2024-01-01: 100 mg via INTRAVENOUS

## 2024-01-01 MED ORDER — PROPOFOL 500 MG/50ML IV EMUL
INTRAVENOUS | Status: DC | PRN
Start: 2024-01-01 — End: 2024-01-01
  Administered 2024-01-01: 150 ug/kg/min via INTRAVENOUS

## 2024-01-01 MED ORDER — ONDANSETRON HCL 4 MG/2ML IJ SOLN
4.0000 mg | Freq: Once | INTRAMUSCULAR | Status: AC
  Administered 2024-01-01: 4 mg via INTRAVENOUS

## 2024-01-01 MED ORDER — PROPOFOL 10 MG/ML IV BOLUS
INTRAVENOUS | Status: DC | PRN
  Administered 2024-01-01: 20 mg via INTRAVENOUS
  Administered 2024-01-01: 30 mg via INTRAVENOUS

## 2024-01-01 MED ORDER — ONDANSETRON HCL 4 MG/2ML IJ SOLN
INTRAMUSCULAR | Status: AC
  Filled 2024-01-01: qty 2

## 2024-01-01 MED ORDER — SODIUM CHLORIDE 0.9 % IV SOLN: 12.5000 mg | INTRAVENOUS | Status: DC | PRN

## 2024-01-01 MED ORDER — OXYCODONE HCL 5 MG PO TABS: 5.0000 mg | ORAL_TABLET | Freq: Once | ORAL | Status: DC | PRN

## 2024-01-01 MED ORDER — ONDANSETRON 4 MG PO TBDP: 4.0000 mg | ORAL_TABLET | Freq: Three times a day (TID) | ORAL | 1 refills | Status: AC | PRN

## 2024-01-01 MED ORDER — HYDROMORPHONE HCL 1 MG/ML IJ SOLN: 0.2500 mg | INTRAMUSCULAR | Status: DC | PRN

## 2024-01-01 MED ORDER — OXYCODONE HCL 5 MG/5ML PO SOLN: 5.0000 mg | Freq: Once | ORAL | Status: DC | PRN

## 2024-01-01 MED ORDER — SODIUM CHLORIDE 0.9 % IV SOLN: INTRAVENOUS | Status: DC

## 2024-01-01 NOTE — Op Note (Signed)
 [facility] Patient Name: Maria Barber Procedure Date: 01/01/2024 MRN: [id number] Attending MD: Lendon Queen. General Kenner , MD, [id number] Date of Birth: 08-24-1991 CSN: [id number] Age: 33 Admit Type: Outpatient Procedure:                Upper GI endoscopy Indications:              Abdominal pain in the right upper quadrant, Early                            satiety, Nausea with vomiting - failed trial of                            omeprazole. Remote US  negative for gallstones. HIDA                            scan 2022 borderline for dyskinesia. Recent GES                            normal. EGD done at hospital due to BMI > 50 Providers:                Lendon Queen. General Kenner, MD, Suzann Ernst, RN, Judith Novak, Technician Referring MD:              Medicines:                Monitored Anesthesia Care Complications:            No immediate complications. Estimated blood loss:                            Minimal. Estimated Blood Loss:     Estimated blood loss was minimal. Procedure:                Pre-Anesthesia Assessment:                           - Prior to the procedure, a History and Physical                            was performed, and patient medications and                            allergies were reviewed. The patient's tolerance of                            previous anesthesia was also reviewed. The risks                            and benefits of the procedure and the sedation                            options and risks were discussed with the patient.  All questions were answered, and informed consent                            was obtained. Prior Anticoagulants: The patient has                            taken no anticoagulant or antiplatelet agents. ASA                            Grade Assessment: III - A patient with severe                            systemic disease. After reviewing the risks and                             benefits, the patient was deemed in satisfactory                            condition to undergo the procedure.                           After obtaining informed consent, the endoscope was                            passed under direct vision. Throughout the                            procedure, the patient's blood pressure, pulse, and                            oxygen saturations were monitored continuously. The                            [id number]-H190 ([id number]) Olympus endoscope was introduced                            through the mouth, and advanced to the second part                            of duodenum. The upper GI endoscopy was                            accomplished without difficulty. The patient                            tolerated the procedure well. Scope In: Scope Out: Findings:      Esophagogastric landmarks were identified: the Z-line was found at 35       cm, the gastroesophageal junction was found at 35 cm and the upper       extent of the gastric folds was found at 36 cm from the incisors.      A 1 cm hiatal hernia was present.      The exam of the esophagus was otherwise normal.      The entire examined  stomach was normal. Biopsies were taken with a cold       forceps for Helicobacter pylori testing.      The examined duodenum was normal. Biopsies for histology were taken with       a cold forceps. Impression:               - Esophagogastric landmarks identified.                           - 1 cm hiatal hernia.                           - Normal esophagus otherwise.                           - Normal stomach. Biopsied.                           - Normal examined duodenum. Biopsied.                           No overt cause for symptoms on this exam. Biliary                            colic remains possible - recommend RUQ US  to                            further evaluate if biopsies negative. Trial of                            scheduled antiemetics if  not yet done. Moderate Sedation:      No moderate sedation, case performed with MAC Recommendation:           - Patient has a contact number available for                            emergencies. The signs and symptoms of potential                            delayed complications were discussed with the                            patient. Return to normal activities tomorrow.                            Written discharge instructions were provided to the                            patient.                           - Resume previous diet.                           - Continue present medications.                           -  Scheduled Zofran  twice to three times daily -                            will discuss with the patient                           - Await pathology results.                           - RUQ US  to screen for gallstones Procedure Code(s):        --- Professional ---                           [phone], Esophagogastroduodenoscopy, flexible,                            transoral; with biopsy, single or multiple Diagnosis Code(s):        --- Professional ---                           K44.9, Diaphragmatic hernia without obstruction or                            gangrene                           R10.11, Right upper quadrant pain                           R68.81, Early satiety                           R11.2, Nausea with vomiting, unspecified CPT copyright 2022 American Medical Association. All rights reserved. The codes documented in this report are preliminary and upon coder review may  be revised to meet current compliance requirements. Landon Pinion P. General Kenner, MD 01/01/2024 8:49:18 AM This report has been signed electronically. Number of Addenda: 0

## 2024-01-01 NOTE — Transfer of Care (Signed)
 Immediate Anesthesia Transfer of Care Note  Patient: [name]  Procedure(s) Performed: EGD (ESOPHAGOGASTRODUODENOSCOPY) BIOPSY, SKIN, SUBCUTANEOUS TISSUE, OR MUCOUS MEMBRANE  Patient Location: PACU and Endoscopy Unit  Anesthesia Type:MAC  Level of Consciousness: awake, alert , and patient cooperative  Airway & Oxygen Therapy: Patient Spontanous Breathing and Patient connected to face mask oxygen  Post-op Assessment: Report given to RN and Post -op Vital signs reviewed and stable  Post vital signs: Reviewed and stable  Last Vitals:  Vitals Value Taken Time  BP 158/100   Temp    Pulse 96 01/01/24 0844  Resp 16 01/01/24 0844  SpO2 100 % 01/01/24 0844  Vitals shown include unfiled device data.  Last Pain:  Vitals:   01/01/24 0715  TempSrc: Temporal  PainSc: 0-No pain         Complications: No notable events documented.

## 2024-01-01 NOTE — Discharge Instructions (Addendum)
 YOU HAD AN ENDOSCOPIC PROCEDURE TODAY: Refer to the procedure report and other information in the discharge instructions given to you for any specific questions about what was found during the examination. If this information does not answer your questions, please call [place] office at [phone] to clarify.   YOU SHOULD EXPECT: Some feelings of bloating in the abdomen. Passage of more gas than usual. Walking can help get rid of the air that was put into your GI tract during the procedure and reduce the bloating. If you had a lower endoscopy (such as a colonoscopy or flexible sigmoidoscopy) you may notice spotting of blood in your stool or on the toilet paper. Some abdominal soreness may be present for a day or two, also.  DIET: Your first meal following the procedure should be a light meal and then it is ok to progress to your normal diet. A half-sandwich or bowl of soup is an example of a good first meal. Heavy or fried foods are harder to digest and may make you feel nauseous or bloated. Drink plenty of fluids but you should avoid alcoholic beverages for 24 hours. If you had a esophageal dilation, please see attached instructions for diet.    ACTIVITY: Your care partner should take you home directly after the procedure. You should plan to take it easy, moving slowly for the rest of the day. You can resume normal activity the day after the procedure however YOU SHOULD NOT DRIVE, use power tools, machinery or perform tasks that involve climbing or major physical exertion for 24 hours (because of the sedation medicines used during the test).   SYMPTOMS TO REPORT IMMEDIATELY: A gastroenterologist can be reached at any hour. Please call [phone]  for any of the following symptoms:  Following lower endoscopy (colonoscopy, flexible sigmoidoscopy) Excessive amounts of blood in the stool  Significant tenderness, worsening of abdominal pains  Swelling of the abdomen that is new, acute  Fever of 100 or  higher  Following upper endoscopy (EGD, EUS, ERCP, esophageal dilation) Vomiting of blood or coffee ground material  New, significant abdominal pain  New, significant chest pain or pain under the shoulder blades  Painful or persistently difficult swallowing  New shortness of breath  Black, tarry-looking or red, bloody stools  FOLLOW UP:  If any biopsies were taken you will be contacted by phone or by letter within the next 1-3 weeks. Call [phone]  if you have not heard about the biopsies in 3 weeks.  Please also call with any specific questions about appointments or follow up tests.     [facility] ENDOSCOPY [address] [place], [place]  [address] Phone:  [phone]   January 01, 2024  Patient: Maria Barber  Date of Birth: Jul 01, 1991  Date of Visit: January 01, 2024    To Whom It May Concern:  Elizabet Schweppe was seen and treated on January 01, 2024 and can return to normal activities on January 02, 2024   .         [facility] [phone]  If you have any questions or concerns, please don't hesitate to call.   Sincerely,       Treatment Team:  Attending Provider: Ace Holder, MD

## 2024-01-01 NOTE — Anesthesia Procedure Notes (Signed)
 Procedure Name: MAC Date/Time: 01/01/2024 8:22 AM  Performed by: Alwyn Juba, CRNAPre-anesthesia Checklist: Patient identified, Emergency Drugs available, Suction available, Patient being monitored and Timeout performed Oxygen Delivery Method: Simple face mask Placement Confirmation: positive ETCO2

## 2024-01-01 NOTE — Anesthesia Postprocedure Evaluation (Signed)
 Anesthesia Post Note  Patient: Maria Barber  Procedure(s) Performed: EGD (ESOPHAGOGASTRODUODENOSCOPY) BIOPSY, SKIN, SUBCUTANEOUS TISSUE, OR MUCOUS MEMBRANE     Patient location during evaluation: PACU Anesthesia Type: MAC Level of consciousness: awake and alert Pain management: pain level controlled Vital Signs Assessment: post-procedure vital signs reviewed and stable Respiratory status: spontaneous breathing, nonlabored ventilation and respiratory function stable Cardiovascular status: blood pressure returned to baseline and stable Postop Assessment: no apparent nausea or vomiting Anesthetic complications: no   No notable events documented.  Last Vitals:  Vitals:   01/01/24 0902 01/01/24 0910  BP: (!) 153/68 (!) 156/95  Pulse: (!) 106 (!) 107  Resp: 17 (!) 26  Temp:    SpO2: 100% 100%    Last Pain:  Vitals:   01/01/24 0910  TempSrc:   PainSc: 0-No pain                 Earvin Goldberg

## 2024-01-01 NOTE — H&P (Signed)
 [facility] History and Physical   Primary Care Physician:  Estill Hemming, DO   Reason for Procedure:   Nausea / vomiting, early satiety, RUQ pain  Plan:    EGD     HPI: Maria Barber is a 33 y.o. female  here for EGD to evaluate multiple upper tract symptoms - nausea / vomiting, early satiety, RUQ pain. Patient states ongoing for years and getting worse over time. Symptoms are intermittent. History of reported gastric ulcers in the past and H pylori, she has had negative H pylori testing with stool test and negative gastric emptying study since the office visit. She has had a RUQ US  but not since 2010, prior HIDA a few years ago showed EF of 42%. Here at the hospital for EGD Today given BMI of 53. She is higher risk for anesthesia given BMI, we discussed risks / benefits of EGD and anesthesia and she wishes to proceed, all questions answered. Otherwise feels well without any cardiopulmonary symptoms.   I have discussed risks / benefits of anesthesia and endoscopic procedure with [name] and they wish to proceed with the exams as outlined today.    Past Medical History:  Diagnosis Date   Gastric ulcer    Medical history non-contributory     Past Surgical History:  Procedure Laterality Date   NO PAST SURGERIES      Prior to Admission medications   Medication Sig Start Date End Date Taking? Authorizing Provider  norethindrone -ethinyl estradiol (LOESTRIN 1/20, 21,) 1-20 MG-MCG tablet Take 1 tablet by mouth daily. Take continuous with a withdrawal bleed every 3 months 12/27/23   Lenord Radon, MD  traMADol  (ULTRAM ) 50 MG tablet Take 1 tablet (50 mg total) by mouth every 6 (six) hours as needed for severe pain (pain score 7-10). Patient not taking: Reported on 12/27/2023 12/06/23   Loetta Ringer, CNM    Current Facility-Administered Medications  Medication Dose Route Frequency Provider Last Rate Last Admin   0.9 %  sodium chloride  infusion    Intravenous Continuous Catera Hankins, Lendon Queen, MD        Allergies as of 12/18/2023 - Review Complete 12/05/2023  Allergen Reaction Noted   Prednisone Rash 05/05/2021    Family History  Problem Relation Age of Onset   Hypertension Mother    Colon cancer Maternal Aunt    Diabetes Paternal Uncle    Cancer Maternal Grandmother        colon   Diabetes Maternal Grandmother    Cancer Cousin        colon   Esophageal cancer Neg Hx    Stomach cancer Neg Hx    Pancreatic cancer Neg Hx     Social History   Socioeconomic History   Marital status: Significant Other    Spouse name: Not on file   Number of children: Not on file   Years of education: Not on file   Highest education level: Some college, no degree  Occupational History   Not on file  Tobacco Use   Smoking status: Never    Passive exposure: Never   Smokeless tobacco: Never  Vaping Use   Vaping status: Never Used  Substance and Sexual Activity   Alcohol use: Not Currently   Drug use: Yes    Types: Marijuana    Comment: daily   Sexual activity: Yes    Partners: Female    Birth control/protection: None    Comment: same sex partners  Other Topics Concern   Not  on file  Social History Narrative   Exercise--  no    Social Drivers of Corporate investment banker Strain: Low Risk  (10/17/2023)   Overall Financial Resource Strain (CARDIA)    Difficulty of Paying Living Expenses: Not hard at all  Food Insecurity: No Food Insecurity (10/17/2023)   Hunger Vital Sign    Worried About Running Out of Food in the Last Year: Never true    Ran Out of Food in the Last Year: Never true  Transportation Needs: No Transportation Needs (10/17/2023)   PRAPARE - Administrator, Civil Service (Medical): No    Lack of Transportation (Non-Medical): No  Physical Activity: Sufficiently Active (10/17/2023)   Exercise Vital Sign    Days of Exercise per Week: 5 days    Minutes of Exercise per Session: 60 min  Stress: Stress Concern  Present (10/17/2023)   [employer] of Occupational Health - Occupational Stress Questionnaire    Feeling of Stress : To some extent  Social Connections: Moderately Isolated (10/17/2023)   Social Connection and Isolation Panel [NHANES]    Frequency of Communication with Friends and Family: Three times a week    Frequency of Social Gatherings with Friends and Family: Three times a week    Attends Religious Services: 1 to 4 times per year    Active Member of Clubs or Organizations: No    Attends Engineer, structural: Not on file    Marital Status: Never married  Intimate Partner Violence: Not on file    Review of Systems: All other review of systems negative except as mentioned in the HPI.  Physical Exam: Vital signs Pulse 90   Temp 97.9 F (36.6 C) (Temporal)   Resp (!) 24   Ht 5\' 3"  (1.6 m)   Wt (!) 136.5 kg   LMP 12/09/2023 (Exact Date)   SpO2 99%   BMI 53.31 kg/m   General:   Alert,  Well-developed, pleasant and cooperative in NAD Lungs:  Clear throughout to auscultation.   Heart:  Regular rate and rhythm Abdomen:  Soft, nontender and nondistended. Protuberant.   Neuro/Psych:  Alert and cooperative. Normal mood and affect. A and O x 3  Christi Coward, MD [facility]

## 2024-01-01 NOTE — Anesthesia Preprocedure Evaluation (Signed)
 Anesthesia Evaluation  Patient identified by MRN, date of birth, ID band Patient awake    Reviewed: Allergy & Precautions, H&P , NPO status , Patient's Chart, lab work & pertinent test results  Airway Mallampati: II  TM Distance: >3 FB Neck ROM: Full    Dental no notable dental hx.    Pulmonary neg pulmonary ROS, Patient abstained from smoking.   Pulmonary exam normal breath sounds clear to auscultation       Cardiovascular negative cardio ROS Normal cardiovascular exam Rhythm:Regular Rate:Normal     Neuro/Psych negative neurological ROS  negative psych ROS   GI/Hepatic Neg liver ROS, PUD,,,  Endo/Other    Class 4 obesity  Renal/GU negative Renal ROS  negative genitourinary   Musculoskeletal negative musculoskeletal ROS (+)    Abdominal  (+) + obese  Peds negative pediatric ROS (+)  Hematology negative hematology ROS (+)   Anesthesia Other Findings   Reproductive/Obstetrics negative OB ROS                             Anesthesia Physical Anesthesia Plan  ASA: 3  Anesthesia Plan: MAC   Post-op Pain Management: Minimal or no pain anticipated   Induction: Intravenous  PONV Risk Score and Plan: 2 and Propofol  infusion and Treatment may vary due to age or medical condition  Airway Management Planned: Nasal Cannula  Additional Equipment:   Intra-op Plan:   Post-operative Plan:   Informed Consent: I have reviewed the patients History and Physical, chart, labs and discussed the procedure including the risks, benefits and alternatives for the proposed anesthesia with the patient or authorized representative who has indicated his/her understanding and acceptance.     Dental advisory given  Plan Discussed with: CRNA  Anesthesia Plan Comments:        Anesthesia Quick Evaluation

## 2024-01-02 ENCOUNTER — Encounter (HOSPITAL_COMMUNITY): Payer: Self-pay | Admitting: Gastroenterology

## 2024-01-02 LAB — SURGICAL PATHOLOGY

## 2024-01-03 ENCOUNTER — Encounter: Payer: Self-pay | Admitting: Obstetrics & Gynecology

## 2024-01-03 ENCOUNTER — Encounter: Payer: Self-pay | Admitting: Gastroenterology

## 2024-01-03 ENCOUNTER — Telehealth: Payer: Self-pay | Admitting: Gastroenterology

## 2024-01-03 DIAGNOSIS — R110 Nausea: Secondary | ICD-10-CM

## 2024-01-03 DIAGNOSIS — R6881 Early satiety: Secondary | ICD-10-CM

## 2024-01-03 DIAGNOSIS — A048 Other specified bacterial intestinal infections: Secondary | ICD-10-CM

## 2024-01-03 MED ORDER — TALICIA 250-12.5-10 MG PO CPDR: 4.0000 | DELAYED_RELEASE_CAPSULE | Freq: Three times a day (TID) | ORAL | 0 refills | Status: AC

## 2024-01-03 MED ORDER — TALICIA 250-12.5-10 MG PO CPDR: 4.0000 | DELAYED_RELEASE_CAPSULE | Freq: Three times a day (TID) | ORAL | 0 refills | Status: DC

## 2024-01-03 NOTE — Telephone Encounter (Signed)
 Dr A-  Please review recent surg path result and advise.

## 2024-01-03 NOTE — Telephone Encounter (Signed)
 I have spoken to patient to advise of results of endoscopy pathology as per Dr General Kenner. Discussed positive H pylori as well as need to treat with Talicia  x 14 days. Advised that patient ask others in her household to be tested for H Pylori since she has recurrent infection and this can be spread between those she is in close contact with (kissing, saliva, utensil sharing). Patient verbalizes understanding of this. Patient has been advised that while she is taking Talicia  and for at least 4 weeks after discontinuing the medication, she should use a back up method of birth control as Talicia  can decrease the effectiveness of oral BCP. Patient verbalizes understanding of this.  Drug-Drug Interaction Report  Hormonal Contraceptives / Moderate CYP3A4 Inducers   Significance: Major   Warning: Plasma concentrations and pharmacologic effects of hormonal contraceptives (eg, norethindrone -ethinyl estradiol) may be decreased by moderate CYP3A4 inducers (eg, Talicia ). Contraceptive failure is possible.  Onset: Delayed    Documentation Level: Suspected    Avoidance Level: None  Interacting Medications/Orders: Hormonal Contraceptives Systemic and Topical, administered by various routes Moderate CYP3A4 Inducers Oral or Non-Oral, Systemic  norethindrone -ethinyl estradiol Order ([id number]): norethindrone -ethinyl estradiol (LOESTRIN 1/20, 21,) 1-20 MG-MCG tablet Route: Oral Start: 12/27/2023 End: none Frequency: Daily Talicia  Order: Amoxicill-Rifabutin-Omeprazole (TALICIA ) 250-12.5-10 MG CPDR Route: Oral Start: 01/03/2024 End: 01/17/2024 Frequency: 3 times daily   Management Code: Professional intervention required  Effects: Plasma concentrations and pharmacologic effects of hormonal contraceptives (eg, norethindrone -ethinyl estradiol) may be decreased by moderate CYP3A4 inducers (eg, Talicia ). Contraceptive failure is possible.  Patient is also advised that Dr General Kenner recommends RUQ abdominal   ultrasound to rule out gallstones as a cause of her symptoms. Patient is scheduled for ultrasound on 01/08/24 at 8 am, 745 am arrival, NPO midnight and she verbalizes understanding of this as well.   Patient will be reminded to come for stool H Pylori testing about 1 month after completion of antibiotics.

## 2024-01-03 NOTE — Telephone Encounter (Signed)
 I just saw results. Can you please relay the following to the patient: - EGD generally looked okay without any obvious problems.  I did take biopsies of her small bowel and stomach. - Biopsies of the small bowel did not show anything concerning. 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  She has had this in the past apparently although it is unclear how she was treated remotely.  I recommend treating her with the following regimen as outlined below.  Given she has had recurrent infection she may also want to have any family members she is living with tested for this as well, as if someone else has it it could lead to recurrent infection - Can you please prescribe her Talicia  (Fixed-dose combination capsules; omeprazole 10 mg, rifabutin 12.5 mg, and amoxicillin 250 g per capsule) four capsules to be taken three times daily for 14 days  Can you please order this if no signfiicant interactions noted. Once done with therapy, the patient should wait one month and perform a stool study for H pylori antigen to ensure negative (please order H pylori diatherix stool swab). The patient should avoid alcohol while taking Flagyl.   - Can you please also order her a RUQ US  to screen for gallstones, which I had suspected could also be playing a role in her symptoms if she has gallstones. Thanks

## 2024-01-03 NOTE — Telephone Encounter (Signed)
 Patient called per the information below. Patient stated that she would like to receive a call back today if all possible. Please advise.

## 2024-01-03 NOTE — Telephone Encounter (Signed)
 Inbound call from patient stating she had got her EGD results and is not understanding. Patient is requesting a call back from nurse to discuss. Please advise.

## 2024-01-08 ENCOUNTER — Ambulatory Visit (HOSPITAL_COMMUNITY)
Admission: RE | Admit: 2024-01-08 | Discharge: 2024-01-08 | Disposition: A | Discharge status: Home / Self Care | Source: Ambulatory Visit | Attending: Gastroenterology | Admitting: Gastroenterology

## 2024-01-08 DIAGNOSIS — R110 Nausea: Secondary | ICD-10-CM | POA: Insufficient documentation

## 2024-01-08 DIAGNOSIS — R6881 Early satiety: Secondary | ICD-10-CM | POA: Insufficient documentation

## 2024-01-09 ENCOUNTER — Encounter: Payer: Self-pay | Admitting: Gastroenterology

## 2024-02-12 ENCOUNTER — Ambulatory Visit: Payer: Self-pay | Admitting: *Deleted

## 2024-02-22 ENCOUNTER — Encounter: Payer: Self-pay | Admitting: Gastroenterology

## 2024-04-08 ENCOUNTER — Ambulatory Visit: Admitting: Gastroenterology

## 2024-04-08 NOTE — Progress Notes (Deleted)
 HPI : seen by Con in March 33 year old female history of H. pylori, duodenal ulcer, chronic nausea, and others as listed below presents for evaluation of chronic nausea   Discussed the use of AI scribe software for clinical note transcription with the patient, who gave verbal consent to proceed.   She experiences worsening nausea that persists throughout the day, beginning upon waking and exacerbated by eating, though it can occur without food intake. This has led to meal avoidance, particularly in the morning. Occasional heartburn occurs, varying with dietary choices. There is a sensation of food sitting in her stomach after eating and early satiety.   She has a history of H pylori infection and a duodenal ulcer diagnosed via endoscopy in 2020. A follow-up in 2022 for pain, nausea, and GERD included a HIDA scan, which showed gallbladder with EF of 42%, and an H pylori stool test, which was negative indicating eradication from her 2 previous infections of H. pylori. She has not undergone a gastric emptying study, which was previously suggested.   She reports constipation, with irregular bowel movements and sometimes not going at all, describing this as 'weird' and inconsistent.       PREVIOUS GI WORKUP    HIDA 06/2021 with EF 42%   H pylori stool antigen negative 05/2021   EGD 02/12/19: - Esophagus. Normal with regular Z line and without findings of esophagitis, Barrett's or stricture - Gastric. Small hiatal hernia. Otherwise normal. Random gastric biopsies obtained for H-pylori - Duodenum (bulb-D2). 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   CTAP with contrast 08/11/20: No acute abnormality or findings to account for reported symptoms.   US  abdomen limited 08/12/16: No cause for the patient's symptoms identified. No gallstones  or wall thickening visualized. No sonographic Murphy sign noted by sonographer    Nausea Early satiety Chronic nausea and early satiety with previous workup including positive H. pylori s/p treatment with confirmed eradication and HIDA scan with EF of 42%.  DDx includes gastroparesis versus biliary dyskinesia versus dyspepsia - Order gastric emptying study to evaluate for gastroparesis. - Perform H. pylori stool test to rule out recurrence. - Prescribe omeprazole 20 mg for heartburn and potential relief of nausea. - Prescribe Zofran  for symptomatic relief of nausea. - If gastric emptying study is normal could consider repeat EGD and then if EGD is normal would consider referral to [facility] for biliary dyskinesia   Biliary dyskinesia Previous HIDA scan showed borderline gallbladder ejection fraction at 42%. Suboptimal function may contribute to symptoms. Further evaluation needed post-gastric emptying study. - Monitor gallbladder function based on results of gastric emptying study and other tests. - Consider cholecystectomy if symptoms persist and other causes are ruled out.   Constipation Intermittent constipation causing gastrointestinal discomfort. Miralax recommended for regulation. - Prescribe Miralax, one cap daily, to regulate bowel movements. - Advise on the use of fiber supplements like Benefiber or Metamucil.     Assigned to Dr. Leigh today    GES 12/14/23: normal  Abdominal pain in the right upper quadrant, Early satiety, Nausea with vomiting - failed trial of omeprazole. Remote US  negative for gallstones. HIDA scan 2022 borderline for dyskinesia. Recent GES normal. EGD done at hospital due to BMI > 50 EGD 01/01/24: Esophagogastric landmarks were identified: the Z-line was found at 35 cm, the gastroesophageal junction was found at 35 cm and the upper  extent of the gastric folds was found at 36 cm from the incisors. Findings: A 1 cm hiatal hernia was present. The exam of the  esophagus was otherwise normal. The entire examined stomach was normal. Biopsies were taken with a cold forceps for Helicobacter pylori testing. The examined duodenum was normal. Biopsies for histology were taken with a cold forceps.  FINAL MICROSCOPIC DIAGNOSIS:   A. SMALL BOWEL, BIOPSY:  - Duodenal and small intestinal mucosa with focal foveolar metaplasia,  otherwise unremarkable  - Negative for increased intraepithelial lymphocytes or villous  architectural changes   B. 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  She has had this in the past apparently although it is unclear how she was treated remotely.  I recommend treating her with the following regimen as outlined below.  Given she has had recurrent infection she may also want to have any family members she is living with tested for this as well, as if someone else has it it could lead to recurrent infection  - Can you please prescribe her Talicia  (Fixed-dose combination capsules; omeprazole 10 mg, rifabutin 12.5 mg, and amoxicillin 250 g per capsule) four capsules to be taken three times daily for 14 days     RUQ US  01/08/24: IMPRESSION: No sonographic etiology for early satiety and nausea is identified.    H pylori eradication testing negative June 2025         Past Medical History:  Diagnosis Date   Gastric ulcer    Medical history non-contributory      Past Surgical History:  Procedure Laterality Date   BIOPSY OF SKIN SUBCUTANEOUS TISSUE AND/OR MUCOUS MEMBRANE  01/01/2024   Procedure: BIOPSY, SKIN, SUBCUTANEOUS TISSUE, OR MUCOUS MEMBRANE;  Surgeon: Leigh Elspeth SQUIBB, MD;  Location: [facility];  Service: Gastroenterology;;   ESOPHAGOGASTRODUODENOSCOPY N/A 01/01/2024   Procedure: EGD  (ESOPHAGOGASTRODUODENOSCOPY);  Surgeon: Leigh Elspeth SQUIBB, MD;  Location: [facility];  Service: Gastroenterology;  Laterality: N/A;   NO PAST SURGERIES     Family History  Problem Relation Age of Onset   Hypertension Mother    Colon cancer Maternal Aunt    Diabetes Paternal Uncle    Cancer Maternal Grandmother        colon   Diabetes Maternal Grandmother    Cancer Cousin        colon   Esophageal cancer Neg Hx    Stomach cancer Neg Hx    Pancreatic cancer Neg Hx    Social History   Tobacco Use   Smoking status: Never    Passive exposure: Never   Smokeless tobacco: Never  Vaping Use   Vaping status: Never Used  Substance Use Topics   Alcohol use: Not Currently   Drug use: Yes    Types: Marijuana    Comment: daily   Current Outpatient Medications  Medication Sig Dispense Refill   norethindrone -ethinyl estradiol (LOESTRIN 1/20, 21,) 1-20 MG-MCG tablet Take 1 tablet by mouth daily. Take continuous with a withdrawal bleed every 3 months 3 tablet 4   ondansetron  (ZOFRAN -ODT) 4 MG disintegrating tablet Take 1 tablet (4 mg total) by mouth every 8 (eight) hours as needed for nausea or vomiting. 50 tablet 1   traMADol  (ULTRAM ) 50 MG tablet Take 1 tablet (50 mg total) by mouth every 6 (  six) hours as needed for severe pain (pain score 7-10). (Patient not taking: Reported on 12/27/2023) 30 tablet 1   No current facility-administered medications for this visit.   Allergies  Allergen Reactions   Prednisone Rash     Review of Systems: All systems reviewed and negative except where noted in HPI.    No results found.  Physical Exam: There were no vitals taken for this visit. Constitutional: Pleasant,well-developed, ***female in no acute distress. HEENT: Normocephalic and atraumatic. Conjunctivae are normal. No scleral icterus. Neck supple.  Cardiovascular: Normal rate, regular rhythm.  Pulmonary/chest: Effort normal and breath sounds normal. No wheezing, rales or  rhonchi. Abdominal: Soft, nondistended, nontender. Bowel sounds active throughout. There are no masses palpable. No hepatomegaly. Extremities: no edema Lymphadenopathy: No cervical adenopathy noted. Neurological: Alert and oriented to person place and time. Skin: Skin is warm and dry. No rashes noted. Psychiatric: Normal mood and affect. Behavior is normal.   ASSESSMENT: 33 y.o. female here for assessment of the following  No diagnosis found.  PLAN:   Antonio Cyndee Jamee JONELLE, *

## 2024-06-09 NOTE — Progress Notes (Unsigned)
 Ellouise Console, PA-C [address] [place], [place]  [address] Phone: [phone]   Primary Care Physician: Antonio Meth, Jamee SAUNDERS, DO  Primary Gastroenterologist:  Ellouise Console, PA-C / Elspeth Naval, MD   Chief Complaint:  F/U abdominal pain, early satiety, nausea, H. pylori gastritis   HPI:   Maria Barber is a 33 y.o. female returns for follow-up of RUQ abdominal pain, early satiety, nausea and vomiting.  She failed trial of omeprazole.  Remote ultrasound negative for gallstones.  HIDA scan 2022 borderline for dyskinesia.  Recent gastric emptying test normal.  EGD was done at hospital due to BMI greater than 50.  01/01/24 EGD by Dr. Naval: 1 cm hiatal hernia.  Otherwise normal esophagus, stomach, and duodenum.  Biopsies negative for celiac.  Biopsies were positive for H. Pylori gastritis.  Treated with Talicia  TID x 14 days (Amoxicillin-Rifabutin-Omeprazole).  02/2024 follow-up H. pylori stool test was negative, confirming eradication.  01/08/2024 RUQ ultrasound: Normal.  No gallstones.  12/14/23 gastric emptying test: Normal.  06/2021 HIDA scan: Normal gallbladder ejection fraction 42%.  She has a history of H pylori infection and a duodenal ulcer diagnosed via endoscopy in 2020. A follow-up in 2022 for pain, nausea, and GERD included a HIDA scan, which showed gallbladder with EF of 42%, and an H pylori stool test, which was negative indicating eradication from her 2 previous infections of H. pylori.   Current symptoms: Patient states she is having recurrence nausea and RUQ pain for 1 to 2 months.  Nausea is worse in the morning however a can go on all throughout the day.  She has nausea if she eats or does not eat.  Also reports early satiety.  Has had a few episodes of vomiting in the morning.  No current PPI or H2 RB.  She avoids all NSAIDs.  Occasionally takes Tylenol .  She admits to daily marijuana use.  She has some bowel irregularities with diarrhea alternating  with constipation.  Denies melena, hematochezia, or weight loss.  Current Outpatient Medications  Medication Sig Dispense Refill   ondansetron  (ZOFRAN -ODT) 4 MG disintegrating tablet Take 1 tablet (4 mg total) by mouth every 8 (eight) hours as needed for nausea or vomiting. 50 tablet 1   No current facility-administered medications for this visit.    Allergies as of 06/10/2024 - Review Complete 06/10/2024  Allergen Reaction Noted   Prednisone Rash 05/05/2021    Past Medical History:  Diagnosis Date   Gastric ulcer    Medical history non-contributory     Past Surgical History:  Procedure Laterality Date   BIOPSY OF SKIN SUBCUTANEOUS TISSUE AND/OR MUCOUS MEMBRANE  01/01/2024   Procedure: BIOPSY, SKIN, SUBCUTANEOUS TISSUE, OR MUCOUS MEMBRANE;  Surgeon: Naval Elspeth SQUIBB, MD;  Location: [facility];  Service: Gastroenterology;;   ESOPHAGOGASTRODUODENOSCOPY N/A 01/01/2024   Procedure: EGD (ESOPHAGOGASTRODUODENOSCOPY);  Surgeon: Naval Elspeth SQUIBB, MD;  Location: [facility];  Service: Gastroenterology;  Laterality: N/A;   NO PAST SURGERIES      Review of Systems:    All systems reviewed and negative except where noted in HPI.    Physical Exam:  BP 124/84   Pulse (!) 102   Ht 5' 3 (1.6 m)   Wt 298 lb 12.8 oz (135.5 kg)   BMI 52.93 kg/m  No LMP recorded.  General: Well-nourished, obese, in no acute distress.  Lungs: Clear to auscultation bilaterally. Non-labored. Heart: Regular rate and rhythm, no murmurs rubs or gallops.  Abdomen: Bowel sounds are  normal; Abdomen is Soft and obese; No hepatosplenomegaly, masses or hernias; there is moderate tenderness over the right anterior lower lateral rib cage.  Mild tenderness in the RUQ.  Otherwise rest of abdomen is not tender.; No guarding or rebound tenderness. Neuro: Alert and oriented x 3.  Grossly intact.  Psych: Alert and cooperative, normal mood and affect.   Imaging Studies: No results found.  Labs: CBC     Component Value Date/Time   WBC 6.2 10/17/2023 1017   RBC 4.15 10/17/2023 1017   HGB 12.9 10/17/2023 1017   HCT 39.2 10/17/2023 1017   PLT 337.0 10/17/2023 1017   MCV 94.7 10/17/2023 1017   MCH 31.5 05/05/2021 1437   MCHC 32.8 10/17/2023 1017   RDW 13.4 10/17/2023 1017   LYMPHSABS 1.7 10/17/2023 1017   MONOABS 0.4 10/17/2023 1017   EOSABS 0.1 10/17/2023 1017   BASOSABS 0.0 10/17/2023 1017    CMP     Component Value Date/Time   NA 139 10/17/2023 1017   K 4.5 10/17/2023 1017   CL 106 10/17/2023 1017   CO2 26 10/17/2023 1017   GLUCOSE 87 10/17/2023 1017   BUN 12 10/17/2023 1017   CREATININE 0.77 10/17/2023 1017   CALCIUM 8.9 10/17/2023 1017   PROT 6.6 10/17/2023 1017   ALBUMIN 4.0 10/17/2023 1017   AST 16 10/17/2023 1017   ALT 16 10/17/2023 1017   ALKPHOS 48 10/17/2023 1017   BILITOT 0.4 10/17/2023 1017   GFRNONAA >60 08/11/2020 0720   GFRAA >60 09/11/2018 1008     Assessment and Plan:   [name] is a 33 y.o. y/o female returns for follow-up of:   1.  H. pylori gastritis treated 12/2023 with Talicia  x 14 days - Ordered repeat H. pylori stool test to check for recurrent infection.  2.  RUQ pain: Suspect musculoskeletal rib cage pain. - Order HIDA scan to rule out biliary dyskinesia  3.  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  Differential also includes recurrent H. pylori gastritis and biliary dyskinesia. - Ordered repeat HIDA scan - Ordered H. pylori stool test (off PPI) - Encouraged cessation of marijuana use.  I discussed risk of hyperemesis cannabis syndrome, patient education handout was given. - If H. pylori stool test is negative, then I recommend start pantoprazole  40 mg daily (prescription not yet given -awaiting test results first).  4.  Marijuana Use: Discussed risk of hyperemesis cannabis syndrome - Encouraged cessation  5.  Morbid Obesity - Encouraged weight loss  Ellouise Console, PA-C  Follow up 2 to 3 months with TG

## 2024-06-10 ENCOUNTER — Other Ambulatory Visit

## 2024-06-10 ENCOUNTER — Encounter: Payer: Self-pay | Admitting: Physician Assistant

## 2024-06-10 ENCOUNTER — Ambulatory Visit: Admitting: Physician Assistant

## 2024-06-10 VITALS — BP 124/84 | HR 102 | Ht 63.0 in | Wt 298.8 lb

## 2024-06-10 DIAGNOSIS — Z6841 Body Mass Index (BMI) 40.0 and over, adult: Secondary | ICD-10-CM

## 2024-06-10 DIAGNOSIS — R110 Nausea: Secondary | ICD-10-CM

## 2024-06-10 DIAGNOSIS — Z9681 Presence of artificial skin: Secondary | ICD-10-CM

## 2024-06-10 DIAGNOSIS — F1290 Cannabis use, unspecified, uncomplicated: Secondary | ICD-10-CM

## 2024-06-10 DIAGNOSIS — R1011 Right upper quadrant pain: Secondary | ICD-10-CM | POA: Diagnosis not present

## 2024-06-10 NOTE — Patient Instructions (Signed)
 Your provider has requested that you go to the basement level for lab work before leaving today. Press B on the elevator. The lab is located at the first door on the left as you exit the elevator.  You have been scheduled for a HIDA scan at [facility] (1st floor) on 06/17/24. Please arrive 30 minutes prior to your scheduled appointment at  9:30 am. Make certain not to have anything to eat or drink at least 6 hours prior to your test. Should this appointment date or time not work well for you, please call radiology scheduling at [phone].  _____________________________________________________________________ hepatobiliary (HIDA) scan is an imaging procedure used to diagnose problems in the liver, gallbladder and bile ducts. In the HIDA scan, a radioactive chemical or tracer is injected into a vein in your arm. The tracer is handled by the liver like bile. Bile is a fluid produced and excreted by your liver that helps your digestive system break down fats in the foods you eat. Bile is stored in your gallbladder and the gallbladder releases the bile when you eat a meal. A special nuclear medicine scanner (gamma camera) tracks the flow of the tracer from your liver into your gallbladder and small intestine.  During your HIDA scan  You'll be asked to change into a hospital gown before your HIDA scan begins. Your health care team will position you on a table, usually on your back. The radioactive tracer is then injected into a vein in your arm.The tracer travels through your bloodstream to your liver, where it's taken up by the bile-producing cells. The radioactive tracer travels with the bile from your liver into your gallbladder and through your bile ducts to your small intestine.You may feel some pressure while the radioactive tracer is injected into your vein. As you lie on the table, a special gamma camera is positioned over your abdomen taking pictures of the tracer as it moves through your  body. The gamma camera takes pictures continually for about an hour. You'll need to keep still during the HIDA scan. This can become uncomfortable, but you may find that you can lessen the discomfort by taking deep breaths and thinking about other things. Tell your health care team if you're uncomfortable. The radiologist will watch on a computer the progress of the radioactive tracer through your body. The HIDA scan may be stopped when the radioactive tracer is seen in the gallbladder and enters your small intestine. This typically takes about an hour. In some cases extra imaging will be performed if original images aren't satisfactory, if morphine is given to help visualize the gallbladder or if the medication CCK is given to look at the contraction of the gallbladder. This test typically takes 2 hours to complete. ________________________________________________________________________ Please follow up sooner if symptoms increase or worsen  Due to recent changes in healthcare laws, you may see the results of your imaging and laboratory studies on MyChart before your provider has had a chance to review them.  We understand that in some cases there may be results that are confusing or concerning to you. Not all laboratory results come back in the same time frame and the provider may be waiting for multiple results in order to interpret others.  Please give us  48 hours in order for your provider to thoroughly review all the results before contacting the office for clarification of your results.   Thank you for trusting me with your gastrointestinal care!   Ellouise Console, PA-C _______________________________________________________  If your blood pressure at your visit was 140/90 or greater, please contact your primary care physician to follow up on this.  _______________________________________________________  If you are age 28 or older, your body mass index should be between 23-30. Your Body mass  index is 52.93 kg/m. If this is out of the aforementioned range listed, please consider follow up with your Primary Care Provider.  If you are age 83 or younger, your body mass index should be between 19-25. Your Body mass index is 52.93 kg/m. If this is out of the aformentioned range listed, please consider follow up with your Primary Care Provider.   ________________________________________________________  The [facility] providers would like to encourage you to use MYCHART to communicate with providers for non-urgent requests or questions.  Due to long hold times on the telephone, sending your provider a message by [facility] may be a faster and more efficient way to get a response.  Please allow 48 business hours for a response.  Please remember that this is for non-urgent requests.  _______________________________________________________

## 2024-06-10 NOTE — Progress Notes (Signed)
 Agree with assessment / plan as outlined.

## 2024-06-11 ENCOUNTER — Other Ambulatory Visit

## 2024-06-11 DIAGNOSIS — R110 Nausea: Secondary | ICD-10-CM

## 2024-06-11 DIAGNOSIS — R1011 Right upper quadrant pain: Secondary | ICD-10-CM

## 2024-06-13 ENCOUNTER — Ambulatory Visit: Payer: Self-pay | Admitting: Physician Assistant

## 2024-06-13 LAB — H. PYLORI ANTIGEN, STOOL: H pylori Ag, Stl: NEGATIVE

## 2024-06-17 ENCOUNTER — Encounter (HOSPITAL_COMMUNITY)
Admission: RE | Admit: 2024-06-17 | Discharge: 2024-06-17 | Disposition: A | Discharge status: Home / Self Care | Source: Ambulatory Visit | Attending: Physician Assistant | Admitting: Physician Assistant

## 2024-06-17 DIAGNOSIS — R1011 Right upper quadrant pain: Secondary | ICD-10-CM | POA: Insufficient documentation

## 2024-06-17 DIAGNOSIS — R110 Nausea: Secondary | ICD-10-CM | POA: Insufficient documentation

## 2024-06-17 MED ORDER — TECHNETIUM TC 99M MEBROFENIN IV KIT
5.0000 | PACK | Freq: Once | INTRAVENOUS | Status: AC
Start: 2024-06-17 — End: 2024-06-17
  Administered 2024-06-17: 5.5 via INTRAVENOUS

## 2024-06-18 NOTE — Progress Notes (Signed)
 Notify patient: HIDA scan shows decreased gallbladder ejection fraction 30%.  Normal is greater than 33%.  I recommend referral to a general surgeon to discuss gallbladder surgery.  Possible chronic cholecystitis versus biliary dyskinesia.  She has had intermittent episodes of RUQ pain and nausea after eating. Ellouise Console, PA-C

## 2024-06-19 ENCOUNTER — Telehealth: Payer: Self-pay | Admitting: Physician Assistant

## 2024-06-19 NOTE — Telephone Encounter (Signed)
 See 06-17-24 HIDA result note for additional information.

## 2024-06-19 NOTE — Telephone Encounter (Signed)
 Patient returning call Requesting a call back  Please advise  Thank you

## 2024-06-26 NOTE — Telephone Encounter (Signed)
 Patient is scheduled to see Dr Emelda at [facility] on 07/05/24.

## 2024-07-05 ENCOUNTER — Ambulatory Visit: Payer: Self-pay | Admitting: General Surgery

## 2024-07-23 NOTE — Patient Instructions (Signed)
 SURGICAL WAITING ROOM VISITATION Patients having surgery or a procedure may have no more than 2 support people in the waiting area - these visitors may rotate.    Children under the age of 38 must have an adult with them who is not the patient.  If the patient needs to stay at the hospital during part of their recovery, the visitor guidelines for inpatient rooms apply. Pre-op nurse will coordinate an appropriate time for 1 support person to accompany patient in pre-op.  This support person may not rotate.    Please refer to the [facility] website for the visitor guidelines for Inpatients (after your surgery is over and you are in a regular room).       Your procedure is scheduled on: 08-05-24   Report to [facility] Main Entrance    Report to admitting at 5:15 AM   Call this number if you have problems the morning of surgery [phone]   Do not eat food or drink liquids:After Midnight.           If you have questions, please contact your surgeon's office.   FOLLOW [facility] ADDITIONAL PRE OP INSTRUCTIONS YOU RECEIVED FROM YOUR SURGEON'S OFFICE!!!     Oral Hygiene is also important to reduce your risk of infection.                                    Remember - BRUSH YOUR TEETH THE MORNING OF SURGERY WITH YOUR REGULAR TOOTHPASTE   Do NOT smoke after Midnight   Take these medicines the morning of surgery with A SIP OF WATER:    Ondansetron  if needed  Stop all vitamins and herbal supplements 7 days before surgery                              You may not have any metal on your body including hair pins, jewelry, and body piercing             Do not wear make-up, lotions, powders, perfumes, or deodorant  Do not wear nail polish including gel and S&S, artificial/acrylic nails, or any other type of covering on natural nails including finger and toenails. If you have artificial nails, gel coating, etc. that needs to be removed by a nail salon please have this removed prior to  surgery or surgery may need to be canceled/ delayed if the surgeon/ anesthesia feels like they are unable to be safely monitored.   Do not shave  48 hours prior to surgery.    Do not bring valuables to the hospital. [facility] IS NOT RESPONSIBLE   FOR VALUABLES.   Contacts, dentures or bridgework may not be worn into surgery.   DO NOT BRING YOUR HOME MEDICATIONS TO THE HOSPITAL. PHARMACY WILL DISPENSE MEDICATIONS LISTED ON YOUR MEDICATION LIST TO YOU DURING YOUR ADMISSION IN THE HOSPITAL!    Patients discharged on the day of surgery will not be allowed to drive home.  Someone NEEDS to stay with you for the first 24 hours after anesthesia.   Special Instructions: Bring a copy of your healthcare power of attorney and living will documents the day of surgery if you haven't scanned them before.              Please read over the following fact sheets you were given: IF YOU HAVE QUESTIONS ABOUT  YOUR PRE-OP INSTRUCTIONS PLEASE CALL [phone] [facility]  If you received a COVID test during your pre-op visit  it is requested that you wear a mask when out in public, stay away from anyone that may not be feeling well and notify your surgeon if you develop symptoms. If you test positive for Covid or have been in contact with anyone that has tested positive in the last 10 days please notify you surgeon.  [facility] - Preparing for Surgery Before surgery, you can play an important role.  Because skin is not sterile, your skin needs to be as free of germs as possible.  You can reduce the number of germs on your skin by washing with CHG (chlorahexidine gluconate) soap before surgery.  CHG is an antiseptic cleaner which kills germs and bonds with the skin to continue killing germs even after washing. Please DO NOT use if you have an allergy to CHG or antibacterial soaps.  If your skin becomes reddened/irritated stop using the CHG and inform your nurse when you arrive at Short Stay. Do not shave (including legs  and underarms) for at least 48 hours prior to the first CHG shower.  You may shave your face/neck.  Please follow these instructions carefully:  1.  Shower with CHG Soap the night before surgery ONLY (DO NOT USE THE SOAP THE MORNING OF SURGERY).  2.  If you choose to wash your hair, wash your hair first as usual with your normal  shampoo.  3.  After you shampoo, rinse your hair and body thoroughly to remove the shampoo.                             4.  Use CHG as you would any other liquid soap.  You can apply chg directly to the skin and wash.  Gently with a scrungie or clean washcloth.  5.  Apply the CHG Soap to your body ONLY FROM THE NECK DOWN.   Do   not use on face/ open                           Wound or open sores. Avoid contact with eyes, ears mouth and   genitals (private parts).                       Wash face,  Genitals (private parts) with your normal soap.             6.  Wash thoroughly, paying special attention to the area where your    surgery  will be performed.  7.  Thoroughly rinse your body with warm water from the neck down.  8.  DO NOT shower/wash with your normal soap after using and rinsing off the CHG Soap.                9.  Pat yourself dry with a clean towel.            10.  Wear clean pajamas.            11.  Place clean sheets on your bed the night of your first shower and do not  sleep with pets. Day of Surgery : Do not apply any CHG, lotions/deodorants the morning of surgery.  Please wear clean clothes to the hospital/surgery center.  FAILURE TO FOLLOW THESE INSTRUCTIONS MAY RESULT IN THE CANCELLATION OF YOUR  SURGERY  PATIENT SIGNATURE_________________________________  NURSE SIGNATURE__________________________________  ________________________________________________________________________

## 2024-07-25 ENCOUNTER — Other Ambulatory Visit: Payer: Self-pay

## 2024-07-25 ENCOUNTER — Encounter (HOSPITAL_COMMUNITY): Payer: Self-pay

## 2024-07-25 ENCOUNTER — Encounter (HOSPITAL_COMMUNITY)
Admission: RE | Admit: 2024-07-25 | Discharge: 2024-07-25 | Disposition: A | Discharge status: Home / Self Care | Source: Ambulatory Visit | Attending: General Surgery | Admitting: General Surgery

## 2024-07-25 VITALS — BP 148/92 | HR 82 | Temp 97.9°F | Resp 20 | Ht 62.0 in | Wt 286.6 lb

## 2024-07-25 DIAGNOSIS — Z01812 Encounter for preprocedural laboratory examination: Secondary | ICD-10-CM | POA: Insufficient documentation

## 2024-07-25 DIAGNOSIS — Z01818 Encounter for other preprocedural examination: Secondary | ICD-10-CM

## 2024-07-25 HISTORY — DX: Other complications of anesthesia, initial encounter: T88.59XA

## 2024-07-25 LAB — CBC
HCT: 40 % (ref 36.0–46.0)
Hemoglobin: 12.7 g/dL (ref 12.0–15.0)
MCH: 31.1 pg (ref 26.0–34.0)
MCHC: 31.8 g/dL (ref 30.0–36.0)
MCV: 97.8 fL (ref 80.0–100.0)
Platelets: 314 K/uL (ref 150–400)
RBC: 4.09 MIL/uL (ref 3.87–5.11)
RDW: 12.5 % (ref 11.5–15.5)
WBC: 8.1 K/uL (ref 4.0–10.5)
nRBC: 0 % (ref 0.0–0.2)

## 2024-07-25 NOTE — Progress Notes (Signed)
 Date of COVID positive in last 90 days:  No  PCP - Jamee Schultze, DO Cardiologist - N/A  Chest x-ray - N/A EKG - N/A Stress Test - N/A ECHO - N/A Cardiac Cath - N/A Pacemaker/ICD device last checked:N/A Spinal Cord Stimulator:N/A  Bowel Prep - N/A  Sleep Study - N/A CPAP -   Fasting Blood Sugar - N/A Checks Blood Sugar _____ times a day  Last dose of GLP1 agonist-  N/A GLP1 instructions:  Do not take after     Last dose of SGLT-2 inhibitors-  N/A SGLT-2 instructions:  Do not take after    Blood Thinner Instructions: N/A Last dose:   Time: Aspirin Instructions:N/A Last Dose:  Activity level:  Can go up a flight of stairs and perform activities of daily living without stopping and without symptoms of chest pain or shortness of breath.  Anesthesia review: N/A  Patient denies shortness of breath, fever, cough and chest pain at PAT appointment  Patient verbalized understanding of instructions that were given to them at the PAT appointment. Patient was also instructed that they will need to review over the PAT instructions again at home before surgery.

## 2024-08-05 ENCOUNTER — Encounter (HOSPITAL_COMMUNITY)
Admission: RE | Disposition: A | Discharge status: Home / Self Care | Payer: Self-pay | Source: Home / Self Care | Attending: General Surgery

## 2024-08-05 ENCOUNTER — Ambulatory Visit (HOSPITAL_COMMUNITY): Payer: Self-pay | Admitting: Medical

## 2024-08-05 ENCOUNTER — Ambulatory Visit (HOSPITAL_BASED_OUTPATIENT_CLINIC_OR_DEPARTMENT_OTHER)

## 2024-08-05 ENCOUNTER — Encounter (HOSPITAL_COMMUNITY): Payer: Self-pay | Admitting: General Surgery

## 2024-08-05 ENCOUNTER — Ambulatory Visit (HOSPITAL_COMMUNITY)
Admission: RE | Admit: 2024-08-05 | Discharge: 2024-08-05 | Disposition: A | Discharge status: Home / Self Care | Attending: General Surgery | Admitting: General Surgery

## 2024-08-05 DIAGNOSIS — K811 Chronic cholecystitis: Secondary | ICD-10-CM | POA: Insufficient documentation

## 2024-08-05 DIAGNOSIS — K828 Other specified diseases of gallbladder: Secondary | ICD-10-CM

## 2024-08-05 DIAGNOSIS — Z01818 Encounter for other preprocedural examination: Secondary | ICD-10-CM

## 2024-08-05 LAB — POCT PREGNANCY, URINE: Preg Test, Ur: NEGATIVE

## 2024-08-05 SURGERY — CHOLECYSTECTOMY, ROBOT-ASSISTED, LAPAROSCOPIC
Anesthesia: General

## 2024-08-05 MED ORDER — LACTATED RINGERS IV SOLN: INTRAVENOUS | Status: DC

## 2024-08-05 MED ORDER — PHENYLEPHRINE HCL (PRESSORS) 10 MG/ML IV SOLN
INTRAVENOUS | Status: DC | PRN
  Administered 2024-08-05: 40 ug via INTRAVENOUS

## 2024-08-05 MED ORDER — BUPIVACAINE-EPINEPHRINE 0.25% -1:200000 IJ SOLN
INTRAMUSCULAR | Status: DC | PRN
Start: 2024-08-05 — End: 2024-08-05
  Administered 2024-08-05: 20 mL

## 2024-08-05 MED ORDER — CHLORHEXIDINE GLUCONATE 0.12 % MT SOLN
15.0000 mL | Freq: Once | OROMUCOSAL | Status: AC
  Administered 2024-08-05: 15 mL via OROMUCOSAL

## 2024-08-05 MED ORDER — KETOROLAC TROMETHAMINE 15 MG/ML IJ SOLN
15.0000 mg | Freq: Four times a day (QID) | INTRAMUSCULAR | Status: DC | PRN
Start: 2024-08-05 — End: 2024-08-05

## 2024-08-05 MED ORDER — PROPOFOL 10 MG/ML IV BOLUS
INTRAVENOUS | Status: AC
  Filled 2024-08-05: qty 20

## 2024-08-05 MED ORDER — ACETAMINOPHEN 10 MG/ML IV SOLN
INTRAVENOUS | Status: AC
Start: 2024-08-05 — End: 2024-08-05
  Filled 2024-08-05: qty 100

## 2024-08-05 MED ORDER — CELECOXIB 200 MG PO CAPS
200.0000 mg | ORAL_CAPSULE | ORAL | Status: AC
  Administered 2024-08-05: 200 mg via ORAL
  Filled 2024-08-05: qty 1

## 2024-08-05 MED ORDER — 0.9 % SODIUM CHLORIDE (POUR BTL) OPTIME
TOPICAL | Status: DC | PRN
  Administered 2024-08-05: 1000 mL

## 2024-08-05 MED ORDER — CEFAZOLIN SODIUM-DEXTROSE 3-4 GM/150ML-% IV SOLN
3.0000 g | INTRAVENOUS | Status: AC
  Administered 2024-08-05: 3 g via INTRAVENOUS
  Filled 2024-08-05: qty 150

## 2024-08-05 MED ORDER — FENTANYL CITRATE (PF) 100 MCG/2ML IJ SOLN
INTRAMUSCULAR | Status: DC | PRN
  Administered 2024-08-05 (×4): 50 ug via INTRAVENOUS

## 2024-08-05 MED ORDER — SCOPOLAMINE 1 MG/3DAYS TD PT72
1.0000 | MEDICATED_PATCH | TRANSDERMAL | Status: DC
  Administered 2024-08-05: 1 via TRANSDERMAL
  Filled 2024-08-05: qty 1

## 2024-08-05 MED ORDER — LIDOCAINE HCL (PF) 2 % IJ SOLN
INTRAMUSCULAR | Status: AC
  Filled 2024-08-05: qty 5

## 2024-08-05 MED ORDER — MIDAZOLAM HCL 2 MG/2ML IJ SOLN
INTRAMUSCULAR | Status: AC
  Filled 2024-08-05: qty 2

## 2024-08-05 MED ORDER — SUGAMMADEX SODIUM 200 MG/2ML IV SOLN
INTRAVENOUS | Status: DC | PRN
  Administered 2024-08-05: 260 mg via INTRAVENOUS

## 2024-08-05 MED ORDER — CHLORHEXIDINE GLUCONATE CLOTH 2 % EX PADS: 6.0000 | MEDICATED_PAD | Freq: Once | CUTANEOUS | Status: DC

## 2024-08-05 MED ORDER — SUGAMMADEX SODIUM 200 MG/2ML IV SOLN
INTRAVENOUS | Status: AC
  Filled 2024-08-05: qty 2

## 2024-08-05 MED ORDER — GABAPENTIN 300 MG PO CAPS
300.0000 mg | ORAL_CAPSULE | ORAL | Status: AC
  Administered 2024-08-05: 300 mg via ORAL
  Filled 2024-08-05: qty 1

## 2024-08-05 MED ORDER — LIDOCAINE HCL (PF) 2 % IJ SOLN
INTRAMUSCULAR | Status: DC | PRN
  Administered 2024-08-05: 100 mg via INTRADERMAL

## 2024-08-05 MED ORDER — ORAL CARE MOUTH RINSE: 15.0000 mL | Freq: Once | OROMUCOSAL | Status: AC

## 2024-08-05 MED ORDER — ROCURONIUM BROMIDE 100 MG/10ML IV SOLN
INTRAVENOUS | Status: DC | PRN
  Administered 2024-08-05: 60 mg via INTRAVENOUS
  Administered 2024-08-05 (×2): 20 mg via INTRAVENOUS

## 2024-08-05 MED ORDER — FENTANYL CITRATE (PF) 50 MCG/ML IJ SOSY
25.0000 ug | PREFILLED_SYRINGE | INTRAMUSCULAR | Status: DC | PRN
  Administered 2024-08-05 (×3): 50 ug via INTRAVENOUS

## 2024-08-05 MED ORDER — METHOCARBAMOL 500 MG PO TABS: 500.0000 mg | ORAL_TABLET | Freq: Four times a day (QID) | ORAL | 0 refills | Status: AC | PRN

## 2024-08-05 MED ORDER — PROPOFOL 10 MG/ML IV BOLUS
INTRAVENOUS | Status: DC | PRN
  Administered 2024-08-05: 150 mg via INTRAVENOUS
  Administered 2024-08-05: 50 mg via INTRAVENOUS

## 2024-08-05 MED ORDER — FENTANYL CITRATE (PF) 100 MCG/2ML IJ SOLN
INTRAMUSCULAR | Status: AC
  Filled 2024-08-05: qty 2

## 2024-08-05 MED ORDER — ONDANSETRON HCL 4 MG/2ML IJ SOLN
INTRAMUSCULAR | Status: AC
  Filled 2024-08-05: qty 2

## 2024-08-05 MED ORDER — STERILE WATER FOR IRRIGATION IR SOLN
Status: DC | PRN
  Administered 2024-08-05: 1000 mL

## 2024-08-05 MED ORDER — ONDANSETRON HCL 4 MG/2ML IJ SOLN
INTRAMUSCULAR | Status: DC | PRN
  Administered 2024-08-05: 4 mg via INTRAVENOUS

## 2024-08-05 MED ORDER — FENTANYL CITRATE (PF) 50 MCG/ML IJ SOSY
PREFILLED_SYRINGE | INTRAMUSCULAR | Status: AC
  Filled 2024-08-05: qty 3

## 2024-08-05 MED ORDER — OXYCODONE HCL 5 MG/5ML PO SOLN: 5.0000 mg | Freq: Once | ORAL | Status: AC | PRN

## 2024-08-05 MED ORDER — INDOCYANINE GREEN 25 MG IV SOLR
1.2500 mg | Freq: Once | INTRAVENOUS | Status: AC
  Administered 2024-08-05: 1.25 mg via INTRAVENOUS
  Filled 2024-08-05: qty 10

## 2024-08-05 MED ORDER — SUCCINYLCHOLINE CHLORIDE 200 MG/10ML IV SOSY
PREFILLED_SYRINGE | INTRAVENOUS | Status: DC | PRN
  Administered 2024-08-05: 130 mg via INTRAVENOUS

## 2024-08-05 MED ORDER — OXYCODONE HCL 5 MG PO TABS
5.0000 mg | ORAL_TABLET | ORAL | 0 refills | Status: AC | PRN
Start: 2024-08-05 — End: ?

## 2024-08-05 MED ORDER — ACETAMINOPHEN 500 MG PO TABS
1000.0000 mg | ORAL_TABLET | ORAL | Status: AC
  Administered 2024-08-05: 1000 mg via ORAL
  Filled 2024-08-05: qty 2

## 2024-08-05 MED ORDER — ROCURONIUM BROMIDE 10 MG/ML (PF) SYRINGE
PREFILLED_SYRINGE | INTRAVENOUS | Status: AC
  Filled 2024-08-05: qty 10

## 2024-08-05 MED ORDER — KETOROLAC TROMETHAMINE 15 MG/ML IJ SOLN
15.0000 mg | Freq: Four times a day (QID) | INTRAMUSCULAR | Status: DC | PRN
Start: 2024-08-05 — End: 2024-08-05
  Administered 2024-08-05: 15 mg via INTRAVENOUS

## 2024-08-05 MED ORDER — KETOROLAC TROMETHAMINE 15 MG/ML IJ SOLN
INTRAMUSCULAR | Status: AC
Start: 2024-08-05 — End: 2024-08-05
  Filled 2024-08-05: qty 1

## 2024-08-05 MED ORDER — OXYCODONE HCL 5 MG PO TABS
ORAL_TABLET | ORAL | Status: AC
  Filled 2024-08-05: qty 1

## 2024-08-05 MED ORDER — OXYCODONE HCL 5 MG PO TABS
5.0000 mg | ORAL_TABLET | Freq: Once | ORAL | Status: AC | PRN
  Administered 2024-08-05: 5 mg via ORAL

## 2024-08-05 MED ORDER — ENOXAPARIN SODIUM 40 MG/0.4ML IJ SOSY
40.0000 mg | PREFILLED_SYRINGE | Freq: Once | INTRAMUSCULAR | Status: AC
  Administered 2024-08-05: 40 mg via SUBCUTANEOUS
  Filled 2024-08-05: qty 0.4

## 2024-08-05 MED ORDER — ACETAMINOPHEN 10 MG/ML IV SOLN: 1000.0000 mg | Freq: Once | INTRAVENOUS | Status: DC | PRN

## 2024-08-05 MED ORDER — ONDANSETRON HCL 4 MG/2ML IJ SOLN: 4.0000 mg | Freq: Once | INTRAMUSCULAR | Status: DC | PRN

## 2024-08-05 MED ORDER — BUPIVACAINE-EPINEPHRINE (PF) 0.25% -1:200000 IJ SOLN
INTRAMUSCULAR | Status: AC
  Filled 2024-08-05: qty 30

## 2024-08-05 MED ORDER — MIDAZOLAM HCL 5 MG/5ML IJ SOLN
INTRAMUSCULAR | Status: DC | PRN
  Administered 2024-08-05: 2 mg via INTRAVENOUS

## 2024-08-05 MED ORDER — DEXAMETHASONE SOD PHOSPHATE PF 10 MG/ML IJ SOLN
INTRAMUSCULAR | Status: DC | PRN
  Administered 2024-08-05: 4 mg via INTRAVENOUS

## 2024-08-05 MED ORDER — AMISULPRIDE (ANTIEMETIC) 5 MG/2ML IV SOLN: 10.0000 mg | Freq: Once | INTRAVENOUS | Status: DC | PRN

## 2024-08-05 SURGICAL SUPPLY — 47 items
BAG COUNTER SPONGE SURGICOUNT (BAG) IMPLANT
BLADE SURG SZ11 CARB STEEL (BLADE) ×1 IMPLANT
CHLORAPREP W/TINT 26 (MISCELLANEOUS) ×1 IMPLANT
CLIP LIGATING HEMO O LOK GREEN (MISCELLANEOUS) ×1 IMPLANT
COVER SURGICAL LIGHT HANDLE (MISCELLANEOUS) ×1 IMPLANT
COVER TIP SHEARS 8 DVNC (MISCELLANEOUS) ×1 IMPLANT
DEFOGGER SCOPE WARM SEASHARP (MISCELLANEOUS) IMPLANT
DERMABOND ADVANCED .7 DNX12 (GAUZE/BANDAGES/DRESSINGS) ×1 IMPLANT
DRAPE ARM DVNC X/XI (DISPOSABLE) ×4 IMPLANT
DRAPE COLUMN DVNC XI (DISPOSABLE) ×1 IMPLANT
DRAPE CV SPLIT W-CLR ANES SCRN (DRAPES) ×1 IMPLANT
DRAPE PERI GROIN 82X75IN TIB (DRAPES) ×1 IMPLANT
DRAPE UTILITY XL STRL (DRAPES) ×1 IMPLANT
ELECTRODE REM PT RTRN 9FT ADLT (ELECTROSURGICAL) ×1 IMPLANT
ENDOLOOP SUT PDS II 0 18 (SUTURE) IMPLANT
FORCEPS BPLR FENES DVNC XI (FORCEP) ×1 IMPLANT
FORCEPS PROGRASP DVNC XI (FORCEP) ×1 IMPLANT
GAUZE 4X4 16PLY ~~LOC~~+RFID DBL (SPONGE) ×1 IMPLANT
GLOVE BIOGEL PI MICRO STRL 6 (GLOVE) ×2 IMPLANT
GLOVE INDICATOR 6.5 STRL GRN (GLOVE) ×2 IMPLANT
GOWN STRL REUS W/ TWL LRG LVL3 (GOWN DISPOSABLE) ×2 IMPLANT
GOWN STRL REUS W/TWL 2XL LVL3 (GOWN DISPOSABLE) ×1 IMPLANT
GRASPER SUT TROCAR 14GX15 (MISCELLANEOUS) IMPLANT
IRRIGATION SUCT STRKRFLW 2 WTP (MISCELLANEOUS) IMPLANT
IRRIGATOR SUCT 8 DISP DVNC XI (IRRIGATION / IRRIGATOR) IMPLANT
KIT BASIN OR (CUSTOM PROCEDURE TRAY) ×1 IMPLANT
KIT IMAGING PINPOINTPAQ (MISCELLANEOUS) IMPLANT
KIT TURNOVER KIT B (KITS) IMPLANT
NDL HYPO 22X1.5 SAFETY MO (MISCELLANEOUS) ×1 IMPLANT
NDL INSUFFLATION 14GA 120MM (NEEDLE) ×1 IMPLANT
OBTURATOR OPTICALSTD 8 DVNC (TROCAR) ×1 IMPLANT
PACK BASIC VI WITH GOWN DISP (CUSTOM PROCEDURE TRAY) ×1 IMPLANT
POUCH RETRIEVAL ECOSAC 10 (ENDOMECHANICALS) IMPLANT
SCISSORS LAP 5X35 DISP (ENDOMECHANICALS) IMPLANT
SCISSORS MNPLR CVD DVNC XI (INSTRUMENTS) ×1 IMPLANT
SEAL UNIV 5-12 XI (MISCELLANEOUS) ×4 IMPLANT
SET TUBE SMOKE EVAC HIGH FLOW (TUBING) ×1 IMPLANT
SOLUTION ANTFG W/FOAM PAD STRL (MISCELLANEOUS) ×1 IMPLANT
SOLUTION ELECTROSURG ANTI STCK (MISCELLANEOUS) ×1 IMPLANT
SPIKE FLUID TRANSFER (MISCELLANEOUS) ×1 IMPLANT
SUT MNCRL AB 4-0 PS2 18 (SUTURE) ×1 IMPLANT
SUT VICRYL 0 UR6 27IN ABS (SUTURE) ×1 IMPLANT
SYR 10ML LL (SYRINGE) ×1 IMPLANT
SYR 20ML LL LF (SYRINGE) ×1 IMPLANT
SYSTEM BAG RETRIEVAL 10MM (BASKET) IMPLANT
SYSTEM RETRIEVL 5MM INZII UNIV (BASKET) IMPLANT
TOWEL GREEN STERILE FF (TOWEL DISPOSABLE) ×1 IMPLANT

## 2024-08-05 NOTE — H&P (Signed)
 HPI  Maria Barber is an 33 y.o. female who was seen in clinic on 07/05/24 for biliary dyskinesia  Patient has had pain for several months. She has had RUQ pain, early satiety and nausea/vomiting. She has undergone extensive workup. Negative for delayed gastric emptying. No evidence of cholelithiasis. She has had EGD that showed gastritis and evidence of H pylori for which she has been treated. HIDA showed decreased EF. Patient states when pain started was related to PO intake especially fatty/greasy foods. However, pain now happens regardless of what she eats.  No fevers/chills, no changes in color of urine or stools.  No prior abdominal surgeries.   10 point review of systems is negative except as listed above in HPI.  Objective  Past Medical History: Past Medical History:  Diagnosis Date   Complication of anesthesia    Tacchycardia after endoscopy   Gastric ulcer    Medical history non-contributory     Past Surgical History: Past Surgical History:  Procedure Laterality Date   BIOPSY OF SKIN SUBCUTANEOUS TISSUE AND/OR MUCOUS MEMBRANE  01/01/2024   Procedure: BIOPSY, SKIN, SUBCUTANEOUS TISSUE, OR MUCOUS MEMBRANE;  Surgeon: Leigh Elspeth SQUIBB, MD;  Location: [facility];  Service: Gastroenterology;;   ESOPHAGOGASTRODUODENOSCOPY N/A 01/01/2024   Procedure: EGD (ESOPHAGOGASTRODUODENOSCOPY);  Surgeon: Leigh Elspeth SQUIBB, MD;  Location: [facility];  Service: Gastroenterology;  Laterality: N/A;   NO PAST SURGERIES     WISDOM TOOTH EXTRACTION      Family History:  Family History  Problem Relation Age of Onset   Hypertension Mother    Colon cancer Maternal Aunt    Diabetes Paternal Uncle    Cancer Maternal Grandmother        colon   Diabetes Maternal Grandmother    Cancer Cousin        colon   Esophageal cancer Neg Hx    Stomach cancer Neg Hx    Pancreatic cancer Neg Hx     Social History:  reports that she has never smoked. She has never been exposed to tobacco  smoke. She has never used smokeless tobacco. She reports that she does not currently use alcohol. She reports current drug use. Drug: Marijuana.  Allergies:  Allergies  Allergen Reactions   Prednisone Rash    Medications: I have reviewed the patient's current medications.  Labs: Pertinent lab work personally reviewed.  Imaging: Pertinent imaging personally reviewed  RUQ US  01/08/24: No cholelithiasis, no gallbladder wall thickening, no pericholecystic fluid. HIDA 06/17/24: EF 30% with patent cystic duct NM Gastric Emptying 12/14/23: No evidence of delayed gastric emptying.  Other: EGD 01/01/24: 1cm hiatal hernia   Physical Exam Blood pressure 127/87, pulse 93, temperature 97.9 F (36.6 C), temperature source Oral, resp. rate 16, weight 130 kg, last menstrual period 07/18/2024, SpO2 98%. General: No acute distress, well appearing HEENT: PERRL, hearing grossly normal, mucous membranes moist CV: Regular rate and rhythm Pulm: Normal work of breathing on room air Abd: Soft, mildly tender to palpation in RUQ, nondistended Extremities: Warm and well perfused Neuro: A&O x4, no focal neurologic deficits Psych: Appropriate mood and effect     Assessment   Maria Barber is an 33 y.o. female with biliary dyskinesia  Plan  - Plan to proceed with elective robotic assisted laparoscopic cholecystectomy with ICG - We discussed the etiology of patient's pain, we discussed treatment options and recommended surgery. We discussed details of surgery including general anesthesia, laparoscopic approach, identification of cystic duct and common bile duct. Ligation of cystic duct  and cystic artery. Possible need for intraoperative cholangiogram, open procedure, and subtotal cholecystectomy. Possible risks of common bile duct injury, injury to surrounding structures, bile leak, bleeding, infection, diarrhea, retained stone and hernia. The patient showed good understanding and all questions were answered.     Orie Silversmith, MD General Surgery, Surgical Critical Care and Trauma

## 2024-08-05 NOTE — Transfer of Care (Signed)
 Immediate Anesthesia Transfer of Care Note  Patient: Maria Barber  Procedure(s) Performed: CHOLECYSTECTOMY, ROBOT-ASSISTED, LAPAROSCOPIC  Patient Location: PACU  Anesthesia Type:General  Level of Consciousness: awake, alert , and oriented  Airway & Oxygen Therapy: Patient Spontanous Breathing  Post-op Assessment: Report given to RN and Post -op Vital signs reviewed and stable  Post vital signs: Reviewed and stable  Last Vitals:  Vitals Value Taken Time  BP 149/56 08/05/24 09:40  Temp 97.2   Pulse 94 08/05/24 09:44  Resp 23 08/05/24 09:44  SpO2 100 % 08/05/24 09:44  Vitals shown include unfiled device data.  Last Pain:  Vitals:   08/05/24 0550  TempSrc: Oral  PainSc:          Complications: No notable events documented.

## 2024-08-05 NOTE — Anesthesia Procedure Notes (Signed)
 Procedure Name: Intubation Date/Time: 08/05/2024 7:58 AM  Performed by: Gladis Honey, CRNAPre-anesthesia Checklist: Patient identified, Emergency Drugs available, Suction available and Patient being monitored Patient Re-evaluated:Patient Re-evaluated prior to induction Oxygen Delivery Method: Circle System Utilized Preoxygenation: Pre-oxygenation with 100% oxygen Induction Type: IV induction Ventilation: Mask ventilation without difficulty Laryngoscope Size: Miller and 2 Grade View: Grade I Tube type: Oral Tube size: 7.0 mm Number of attempts: 1 Airway Equipment and Method: Stylet and Oral airway Placement Confirmation: ETT inserted through vocal cords under direct vision, positive ETCO2 and breath sounds checked- equal and bilateral Secured at: 21 cm Tube secured with: Tape Dental Injury: Teeth and Oropharynx as per pre-operative assessment

## 2024-08-05 NOTE — Op Note (Signed)
 08/05/2024 9:51 AM  PATIENT: Maria Barber  33 y.o. female  Patient Care Team: Antonio Meth, Jamee SAUNDERS, DO as PCP - General (Family Medicine)  PRE-OPERATIVE DIAGNOSIS: biliary dyskinesia  POST-OPERATIVE DIAGNOSIS: biliary dyskinesia  PROCEDURE: robotic assisted laparoscopic cholecystectomy  SURGEON: Orie Silversmith, MD  ASSISTANT: None  ANESTHESIA: General endotracheal  EBL: 3cc  DRAINS: None  SPECIMEN: Gallbladder  COUNTS: Sponge, needle and instrument counts were reported correct x2 at the conclusion of the operation  DISPOSITION: PACU in satisfactory condition  COMPLICATIONS: None  FINDINGS: Critical view of safety achieved prior to clip placement. ICG showed fluorescence of gallbladder, cystic duct, CBD and common hepatic duct. Junction of CBD and cystic duct well visualized and far away from area of dissection and clips  DESCRIPTION:  Preoperative indocyanine green  was administered in preoperative holding. The patient was identified & brought into the operating room. She was then positioned supine on the OR table. SCDs were in place and active during the entire case. She then underwent general endotracheal anesthesia. Pressure points were padded. Hair on the abdomen was clipped by the OR team. The abdomen was prepped and draped in the standard sterile fashion. Antibiotics were administered. A surgical timeout was performed and confirmed our plan.  A small incision was made in the LUQ at Palmer's point and a veress needle was inserted. Air was aspirated and subsequent positive drop test. Abdomen then insufflated to . An  incision slightly to right of umbilicus was then made and an 8mm trocar optiview using a 30 degree scope was inserted and the abdomen was entered under direct visualization. Inspection confirmed no evidence of trocar or veress site complications. The veress was then removed.    Three additional trocars were placed under direct visualization, one 8mm in  right hemiabdomen, one 8mm in left mid hemiabdomen and one 12mm port in left lateral hemiabdomen. The patient was then positioned in reverse Trendelenburg with slight left side down.  The liver and gallbladder were inspected. The gallbladder fundus was grasped and elevated cephalad. An additional grasper was then placed on the infundibulum of the gallbladder and the infundibulum was retracted laterally. Staying high on the gallbladder, the peritoneum on both sides of the gallbladder was opened with hook cautery. Gentle blunt dissection was then employed with a hook bluntly and with electrocautery working down into Calot's triangle. The cystic duct was identified and carefully circumferentially dissected. The cystic artery was also identified and carefully circumferentially dissected. The space between the cystic artery and hepatocystic plate was developed such that a good view of the liver could be seen through a window medial to the cystic artery. The triangle of Calot had been cleared of all fibrofatty tissue. At this point, a critical view of safety was achieved and the only structures visualized was the skeletonized cystic duct laterally, the skeletonized cystic artery and the liver through the window medial to the artery. No posterior cystic artery was noted  Attention turned to infrared fluorescent cholangiography with indocyanine green  which was visualized within the common hepatic duct, common bile duct, cystic duct and small bowel.  The cystic duct and artery were clipped with 2 hemolock clips on the patient side and 1 clip on the specimen side. The cystic duct and artery were then divided. The gallbladder was then freed from its remaining attachments to the liver using electrocautery and placed into an endocatch bag. The RUQ was gently irrigated with sterile saline. Hemostasis was then verified. The clips were in good position; the gallbladder  fossa was dry. The rest of the abdomen was inspected no  injury nor bleeding elsewhere was identified.  The specimen bag containing the gallbladder was then removed from the left most port site and passed off as specimen. The left most port fascia was then closed with a simple stich using 0 vicryl using a suture passer. The remaining ports were removed under direct visualization and noted to be hemostatic. The fascia was palpated and noted to be completely closed. The abdomen was then desufflated and the periumbilical trocar removed. The skin of all incision sites was approximated with 4-0 monocryl subcuticular suture and dermabond applied. The was then awakened from anesthesia, extubated, and transferred to a stretcher for transport to PACU in satisfactory condition.  Instrument, sponge, and needle counts were correct at closure and at the conclusion of the case.   Orie Silversmith, MD [facility]

## 2024-08-05 NOTE — Anesthesia Preprocedure Evaluation (Addendum)
 Anesthesia Evaluation  Patient identified by MRN, date of birth, ID band  Reviewed: Allergy & Precautions, NPO status , Patient's Chart, lab work & pertinent test results  History of Anesthesia Complications Negative for: history of anesthetic complications  Airway Mallampati: I  TM Distance: >3 FB Neck ROM: Full    Dental  (+) Teeth Intact, Dental Advisory Given   Pulmonary Patient abstained from smoking.   breath sounds clear to auscultation       Cardiovascular (-) angina (-) PND + dysrhythmias (?Tachyarrhythmia following EGD)  Rhythm:Regular Rate:Normal     Neuro/Psych    GI/Hepatic PUD,,,(+)     substance abuse  marijuana useBiliary Dyskinesia   Endo/Other  neg diabetes    Renal/GU      Musculoskeletal BMI 52   Abdominal   Peds  Hematology Hgb 12.7, Plts 314K (07/25/24)   Anesthesia Other Findings   Reproductive/Obstetrics                              Anesthesia Physical Anesthesia Plan  ASA: 3  Anesthesia Plan: General   Post-op Pain Management:    Induction: Intravenous and Rapid sequence  PONV Risk Score and Plan: 3 and Ondansetron , Dexamethasone , Metaclopromide, Midazolam , Treatment may vary due to age or medical condition and Scopolamine  patch - Pre-op  Airway Management Planned: Oral ETT  Additional Equipment: None  Intra-op Plan:   Post-operative Plan: Extubation in OR  Informed Consent:      Dental advisory given  Plan Discussed with: CRNA  Anesthesia Plan Comments: (Actively nauseous on examination - plan for GETA via RSI. )         Anesthesia Quick Evaluation

## 2024-08-05 NOTE — Anesthesia Postprocedure Evaluation (Signed)
 Anesthesia Post Note  Patient: Maria Barber  Procedure(s) Performed: CHOLECYSTECTOMY, ROBOT-ASSISTED, LAPAROSCOPIC     Patient location during evaluation: PACU Anesthesia Type: General Level of consciousness: awake Pain management: pain level controlled Vital Signs Assessment: post-procedure vital signs reviewed and stable Respiratory status: spontaneous breathing Cardiovascular status: blood pressure returned to baseline Postop Assessment: no apparent nausea or vomiting Anesthetic complications: no   No notable events documented.  Last Vitals:  Vitals:   08/05/24 1123 08/05/24 1130  BP: (!) 145/92   Pulse: 88 99  Resp: 15 14  Temp: 36.8 C   SpO2: 98%     Last Pain:  Vitals:   08/05/24 1130  TempSrc:   PainSc: 4                  Lauraine KATHEE [place]

## 2024-08-07 LAB — SURGICAL PATHOLOGY
